# Patient Record
Sex: Female | Born: 1970 | Race: White | Hispanic: No | Marital: Married | Smoking: Former smoker
Health system: Southern US, Community
[De-identification: ages and names within clinical notes are randomized; demographics above are authoritative.]

## PROBLEM LIST (undated history)

## (undated) DIAGNOSIS — R4586 Emotional lability: Secondary | ICD-10-CM

## (undated) DIAGNOSIS — S37009A Unspecified injury of unspecified kidney, initial encounter: Secondary | ICD-10-CM

## (undated) DIAGNOSIS — Z9289 Personal history of other medical treatment: Secondary | ICD-10-CM

## (undated) DIAGNOSIS — G4730 Sleep apnea, unspecified: Secondary | ICD-10-CM

## (undated) DIAGNOSIS — J449 Chronic obstructive pulmonary disease, unspecified: Secondary | ICD-10-CM

## (undated) DIAGNOSIS — G8929 Other chronic pain: Secondary | ICD-10-CM

## (undated) DIAGNOSIS — M545 Low back pain, unspecified: Secondary | ICD-10-CM

## (undated) DIAGNOSIS — G4700 Insomnia, unspecified: Secondary | ICD-10-CM

## (undated) DIAGNOSIS — K219 Gastro-esophageal reflux disease without esophagitis: Secondary | ICD-10-CM

## (undated) DIAGNOSIS — M069 Rheumatoid arthritis, unspecified: Secondary | ICD-10-CM

## (undated) DIAGNOSIS — M797 Fibromyalgia: Secondary | ICD-10-CM

## (undated) HISTORY — PX: INCONTINENCE SURGERY: SHX676

## (undated) HISTORY — DX: Gastro-esophageal reflux disease without esophagitis: K21.9

## (undated) HISTORY — PX: BILATERAL CARPAL TUNNEL RELEASE: SHX6508

## (undated) HISTORY — PX: VAGINAL HYSTERECTOMY: SUR661

## (undated) HISTORY — PX: TUBAL LIGATION: SHX77

## (undated) HISTORY — PX: FRACTURE SURGERY: SHX138

## (undated) HISTORY — PX: TIBIA FRACTURE SURGERY: SHX806

## (undated) HISTORY — DX: Chronic obstructive pulmonary disease, unspecified: J44.9

## (undated) HISTORY — PX: FIXATION KYPHOPLASTY: SHX860

## (undated) HISTORY — PX: WISDOM TOOTH EXTRACTION: SHX21

## (undated) HISTORY — PX: BACK SURGERY: SHX140

## (undated) HISTORY — PX: AUGMENTATION MAMMAPLASTY: SUR837

---

## 1992-08-03 HISTORY — PX: EXPLORATORY LAPAROTOMY: SUR591

## 1992-08-03 HISTORY — PX: COLOSTOMY: SHX63

## 1992-08-03 HISTORY — PX: COLECTOMY: SHX59

## 1993-08-03 HISTORY — PX: COLOSTOMY REVERSAL: SHX5782

## 1999-02-12 ENCOUNTER — Emergency Department (HOSPITAL_COMMUNITY): Admission: EM | Admit: 1999-02-12 | Discharge: 1999-02-13 | Payer: Self-pay | Admitting: Emergency Medicine

## 2001-02-10 ENCOUNTER — Emergency Department (HOSPITAL_COMMUNITY): Admission: EM | Admit: 2001-02-10 | Discharge: 2001-02-10 | Payer: Self-pay | Admitting: Emergency Medicine

## 2002-02-28 ENCOUNTER — Emergency Department (HOSPITAL_COMMUNITY): Admission: EM | Admit: 2002-02-28 | Discharge: 2002-02-28 | Payer: Self-pay | Admitting: Emergency Medicine

## 2002-02-28 ENCOUNTER — Encounter: Payer: Self-pay | Admitting: Emergency Medicine

## 2003-06-01 ENCOUNTER — Encounter: Admission: RE | Admit: 2003-06-01 | Discharge: 2003-08-30 | Payer: Self-pay | Admitting: *Deleted

## 2003-07-02 ENCOUNTER — Emergency Department (HOSPITAL_COMMUNITY): Admission: EM | Admit: 2003-07-02 | Discharge: 2003-07-02 | Payer: Self-pay | Admitting: Emergency Medicine

## 2003-07-25 ENCOUNTER — Ambulatory Visit (HOSPITAL_COMMUNITY): Admission: RE | Admit: 2003-07-25 | Discharge: 2003-07-25 | Payer: Self-pay | Admitting: Neurosurgery

## 2003-08-10 ENCOUNTER — Inpatient Hospital Stay (HOSPITAL_COMMUNITY): Admission: RE | Admit: 2003-08-10 | Discharge: 2003-08-11 | Payer: Self-pay | Admitting: Neurosurgery

## 2003-11-06 ENCOUNTER — Ambulatory Visit (HOSPITAL_COMMUNITY): Admission: RE | Admit: 2003-11-06 | Discharge: 2003-11-06 | Payer: Self-pay | Admitting: Neurosurgery

## 2004-08-25 ENCOUNTER — Encounter: Payer: Self-pay | Admitting: Anesthesiology

## 2004-09-02 ENCOUNTER — Emergency Department (HOSPITAL_COMMUNITY): Admission: EM | Admit: 2004-09-02 | Discharge: 2004-09-02 | Payer: Self-pay | Admitting: Emergency Medicine

## 2005-07-08 ENCOUNTER — Encounter (INDEPENDENT_AMBULATORY_CARE_PROVIDER_SITE_OTHER): Payer: Self-pay | Admitting: Specialist

## 2005-07-08 ENCOUNTER — Inpatient Hospital Stay (HOSPITAL_COMMUNITY): Admission: RE | Admit: 2005-07-08 | Discharge: 2005-07-10 | Payer: Self-pay | Admitting: Gynecology

## 2008-01-23 ENCOUNTER — Emergency Department (HOSPITAL_COMMUNITY): Admission: EM | Admit: 2008-01-23 | Discharge: 2008-01-23 | Payer: Self-pay | Admitting: Emergency Medicine

## 2008-01-26 ENCOUNTER — Ambulatory Visit: Payer: Self-pay | Admitting: Pain Medicine

## 2008-02-13 ENCOUNTER — Other Ambulatory Visit: Payer: Self-pay | Admitting: Emergency Medicine

## 2008-02-13 ENCOUNTER — Inpatient Hospital Stay (HOSPITAL_COMMUNITY): Admission: RE | Admit: 2008-02-13 | Discharge: 2008-02-23 | Payer: Self-pay | Admitting: Psychiatry

## 2008-02-13 ENCOUNTER — Ambulatory Visit: Payer: Self-pay | Admitting: Psychiatry

## 2008-08-10 ENCOUNTER — Ambulatory Visit: Payer: Self-pay

## 2009-04-30 ENCOUNTER — Encounter: Admission: RE | Admit: 2009-04-30 | Discharge: 2009-04-30 | Payer: Self-pay | Admitting: Neurosurgery
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
## 2010-12-19 NOTE — Op Note (Signed)
[name], Nancy Decker            ACCOUNT NO.:  [id number]   MEDICAL RECORD NO.:  [id number]          PATIENT TYPE:  INP   LOCATION:  9399                          FACILITY:  [facility]   PHYSICIAN:  Ginger Carne, MD  DATE OF BIRTH:  1970-11-10   DATE OF PROCEDURE:  07/08/2005  DATE OF DISCHARGE:                                 OPERATIVE REPORT   PREOPERATIVE DIAGNOSES:  1.  Bilateral pelvic pain.  2.  Bilateral adnexal adhesions.  3.  Genuine urinary stress incontinence.   POSTOPERATIVE DIAGNOSES:  1.  Bilateral pelvic pain.  2.  Bilateral adnexal adhesions.  3.  Genuine urinary stress incontinence.   PROCEDURES:  1.  Bilateral salpingo-oophorectomy by laparotomy.  2.  Tension-free vaginal tape procedure.  3.  Cystoscopy.   SURGEON:  Ginger Carne, M.D.   ASSISTANT:  None.   COMPLICATIONS:  None immediate.   ESTIMATED BLOOD LOSS LESS:  Than 50 mL.   ANESTHESIA:  General.   SPECIMEN:  Right and left tube and ovary.   FINDINGS:  Both adnexa were adherent to their respective sidewalls with  omentum and distal intestine adherent to respective adnexa.  The uterus had  been previously excised.  Both ureters identified throughout their pelvic  course during the procedure.   The patient prepped and draped in usual fashion and placed in lithotomy  position.  Betadine solution used for antiseptic.  The patient was  catheterized prior to the procedure.  Afterwards a vertical infraumbilical  incision was made and the abdomen opened, appropriate traction and packing  was utilized.  Dissection of the ureters followed by clamp, then ligating  the round ligaments on either side and opening up the lateral peritoneal  reflection.  After identifying the ureters, the left and right  infundibulopelvic ligaments were clamped and ligated with 0 Vicryl suture  twice.  The adnexa were then carefully dissected off their respective  sidewalls, including dissection of intestinal and  omental adhesions.  Both  specimens sent separately.  Bleeding points hemostatically checked, blood  clots removed.  Irrigation utilized.  Closure of the peritoneum with 2-0  Vicryl running sutures, 0 Vicryl for the fascia, and skin staples for the  skin.  Attention was directed to the TVT portion of the procedure.   A vertical anterior vaginal wall incision was made over the midurethral  region.  The pubovesical cervical fascia was carefully dissected off the  vaginal epithelium bilaterally up to the space of Retzius.  At this point a  Foley catheter bladder guide was utilized and using a bottom-up [employer] system, a trocar was placed beneath the lower ramus of  the pubic bone, hugging the back side of said pubic bone and emanating 1-2  cm lateral to the symphysis pubis on either side.  At this point, cystoscopy  performed.  No injury to urethra or bladder, including sidewalls, dome and  trigone were noted.  Afterwards, appropriate tensioning was performed by  draining the bladder and filling it to 250 mL of normal saline.  After  appropriate tensioning, the plastic sleeves were removed.  The sheath  was  then cut beneath the either side below the skin level.  Dermabond used for  closure.  Copious irrigation followed.  Bleeding points hemostatically  checked.  Closure of the vaginal epithelium with 3-0 Monocryl running  interlocking suture.  The patient tolerated procedure the well, returned to  the postanesthesia recovery room in excellent condition.  Urine clear at the  end of the procedure.      Ginger Carne, MD  Electronically Signed     SHB/MEDQ  D:  07/08/2005  T:  07/08/2005  Job:  [id number]
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
## 2010-12-19 NOTE — Discharge Summary (Signed)
NAMECORTNE, AMARA            ACCOUNT NO.:  [id number]   MEDICAL RECORD NO.:  [id number]          PATIENT TYPE:  INP   LOCATION:  9315                          [facility]ACILITY[facility]   PHYSICIAN:  Ginger Carne, MD  DATE OF BIRTH:  [birth year]   DATE OF ADMISSION:  07/08/2005  DATE OF DISCHARGE:                                 DISCHARGE SUMMARY   REASON FOR HOSPITALIZATION:  Genuine urinary stress incontinence, chronic  pelvic pain and bilateral ovarian adhesions.   POSTOPERATIVE DIAGNOSIS:  Genuine urinary stress incontinence, chronic  pelvic pain and bilateral ovarian adhesions.   IN-HOSPITAL PROCEDURES:  Bilateral salpingo-oophorectomy by laparotomy,  tension-free vaginal tape procedure with cystoscopy.   FINAL DIAGNOSIS:  Genuine urinary stress incontinence, chronic pelvic pain  and bilateral ovarian adhesions.   HOSPITAL COURSE:  This is a 40 year old gravida 2 para 2-0-0-2 Caucasian  female admitted for the afore-mentioned procedures performed on  December6,2006, because of bilateral adhesive disease secondary to previous  surgery. The patient had a hysterectomy in 1995. In addition, the patient  has had symptoms with appropriate workup for genuine urinary stress  incontinence. Her postoperative course was uneventful. She remained  afebrile. Her incision was dry. Her calves are without tenderness. Lungs  were clear. The patient voided satisfactorily after removal of Foley  catheter 1 day after surgery. Her postoperative hemoglobin was 10.9.   The patient was discharged with routine instructions including contacting  the office for temperature elevation above 100.4 degrees Fahrenheit,  increasing abdominal or incisional pain and/or drainage, difficulty with  voiding including urinary retention symptoms. The patient was prescribed  Augmentin 500 mg twice a day for 7 days, Percocet 5/325 one to two every 4-6  hours as needed for pain, and Premarin 1.25 mg twice a day. She will  return  in 5 days to have her staples removed and then for a 4-week postoperative  follow-up visit.      Ginger Carne, MD  Electronically Signed     SHB/MEDQ  D:  07/10/2005  T:  07/10/2005  Job:  [id number]

## 2010-12-19 NOTE — Discharge Summary (Signed)
Nancy Decker, DEPACE            ACCOUNT NO.:  [id number]   MEDICAL RECORD NO.:  [id number]          PATIENT TYPE:  IPS   LOCATION:  0301                          FACILITY:  [facility]   PHYSICIAN:  Anselm Jungling, MD  DATE OF BIRTH:  27-Jun-1971   DATE OF ADMISSION:  02/13/2008  DATE OF DISCHARGE:  02/23/2008                               DISCHARGE SUMMARY   IDENTIFYING DATA/REASON FOR ADMISSION:  The patient is a 40 year old  married white female admitted for opiate dependence.  She indicated that  she had become dependent on her opiate pain medication that had been  prescribed for her.  She denied alcohol abuse, and denied other drugs of  abuse, except for some occasional marijuana.  She had also been using a  certain amount of prescription Klonopin.  Please refer to the admission  note for further details pertaining to the symptoms, circumstances and  history that led to her hospitalization.  She was given initial Axis I  diagnosis of opiate dependence, NOS and substance-induced mood disorder.   MEDICAL/LABORATORY:  The patient was medically and physically assessed  by the Psychiatric Nurse Practitioner.  She was in generally good health  without any acute medical problems, although she had had long-term  difficulties with pain and its management.  She also has a history of  asthma and was continued on Advair for this.   HOSPITAL COURSE:  The patient was admitted to the Adult Inpatient  Psychiatric service.  She presented as a well-nourished, well-developed  woman who was alert, fully oriented, and very sad, depressed and  anxious.  There were no signs or symptoms of psychosis or thought  disorder.  There was no suicidal ideation.  She was quite open about her  abuse patterns.  She stated I have got a beautiful life.  She  indicated a strong and apparently sincere desire to get into sober  recovery.   The patient was placed on a withdrawal protocol.  This was supplemented  with  Skelaxin for muscle relaxation, Vistaril, and Imitrex for migraine  headaches that occurred sporadically during her stay.   She participated in therapeutic groups and activities including those  geared towards 12-step recovery.  She was a good participant in the  treatment program.  She did have some pain complaints, and these were  addressed with a trial of Voltaren EC 75 mg b.i.d., which was somewhat  helpful.   She had significant withdrawal symptoms that diminished over the course  of her stay.  She had a fairly good attitude about this throughout,  indicating her strong resolve to get through her withdrawal, so that she  could enjoyed a clean and sober lifestyle and better health on the other  side.  Clonidine and sometimes Librium were used on a p.r.n. basis to  get her through this period.   Her withdrawal continued to be challenging for her, with protracted  withdrawal symptoms, occasional problems with migraine, and general  fatigue, and not feeling well.  Her mood was depressed through much of  this.  However, we reached a point where it appeared that she  was no  longer experiencing physical discomfort due to withdrawal, but more  likely to other underlying physical issues.  The patient agreed with [place]  that it was appropriate for her to be discharged to outpatient care.  She agreed to the following aftercare plan.   AFTERCARE:  The patient was to follow-up at the [facility] with an  appointment on the day following discharge, February 24, 2008.   DISCHARGE MEDICATIONS:  1. Advair 250/50 1 puff twice daily.  2. Cymbalta 30 mg daily.   DISCHARGE DIAGNOSES:  AXIS I: Opiate dependence and substance-induced  mood disorder.  AXIS II: Deferred.  AXIS III: Chronic pain, asthma.  AXIS IV: Stressors severe.  AXIS V: GAF on discharge 55.      Anselm Jungling, MD  Electronically Signed     SPB/MEDQ  D:  03/14/2008  T:  03/14/2008  Job:  [phone]

## 2011-04-30 LAB — URINALYSIS, ROUTINE W REFLEX MICROSCOPIC
Bilirubin Urine: NEGATIVE
Bilirubin Urine: NEGATIVE
Glucose, UA: NEGATIVE
Glucose, UA: NEGATIVE
Hgb urine dipstick: NEGATIVE
Hgb urine dipstick: NEGATIVE
Ketones, ur: NEGATIVE
Nitrite: NEGATIVE
Nitrite: NEGATIVE
Protein, ur: NEGATIVE
Protein, ur: NEGATIVE
Specific Gravity, Urine: 1.018
Specific Gravity, Urine: 1.037 — ABNORMAL HIGH
Urobilinogen, UA: 0.2
Urobilinogen, UA: 0.2
pH: 6
pH: 6

## 2011-04-30 LAB — DIFFERENTIAL
Basophils Absolute: 0
Basophils Relative: 0
Eosinophils Absolute: 0
Eosinophils Relative: 0
Lymphocytes Relative: 17
Lymphs Abs: 1.4
Monocytes Absolute: 0.4
Monocytes Relative: 5
Neutro Abs: 6.3
Neutrophils Relative %: 77

## 2011-04-30 LAB — CBC
HCT: 35.5 — ABNORMAL LOW
Hemoglobin: 12.1
MCHC: 34.2
MCV: 91
Platelets: 240
RBC: 3.9
RDW: 13.2
WBC: 8.2

## 2011-04-30 LAB — POCT I-STAT, CHEM 8
BUN: 7
Calcium, Ion: 1.2
Chloride: 107
Creatinine, Ser: 0.7
Glucose, Bld: 98
HCT: 36
HCT: 42
Hemoglobin: 12.2
Hemoglobin: 14.3
Potassium: 4.1
Potassium: 3.7
Sodium: 140
Sodium: 143
TCO2: 26
TCO2: 25

## 2011-04-30 LAB — ETHANOL: Alcohol, Ethyl (B): <5

## 2011-04-30 LAB — POCT CARDIAC MARKERS
CKMB, poc: <1 — ABNORMAL LOW
Myoglobin, poc: 27
Operator id: 234501
Troponin i, poc: <0.05

## 2011-04-30 LAB — POCT PREGNANCY, URINE
Operator id: 234501
Preg Test, Ur: NEGATIVE

## 2011-04-30 LAB — RAPID URINE DRUG SCREEN, HOSP PERFORMED
Amphetamines: NOT DETECTED
Barbiturates: NOT DETECTED
Benzodiazepines: NOT DETECTED
Cocaine: POSITIVE — AB
Opiates: POSITIVE — AB
Tetrahydrocannabinol: POSITIVE — AB

## 2011-04-30 LAB — PREGNANCY, URINE: Preg Test, Ur: NEGATIVE

## 2011-10-08 ENCOUNTER — Observation Stay (HOSPITAL_COMMUNITY)
Admission: EM | Admit: 2011-10-08 | Discharge: 2011-10-09 | Disposition: A | Discharge status: Home Health | Payer: Self-pay | Source: Ambulatory Visit | Attending: Emergency Medicine | Admitting: Emergency Medicine

## 2011-10-08 ENCOUNTER — Encounter (HOSPITAL_COMMUNITY): Payer: Self-pay

## 2011-10-08 ENCOUNTER — Emergency Department (HOSPITAL_COMMUNITY): Payer: Self-pay

## 2011-10-08 DIAGNOSIS — R300 Dysuria: Secondary | ICD-10-CM | POA: Insufficient documentation

## 2011-10-08 DIAGNOSIS — N12 Tubulo-interstitial nephritis, not specified as acute or chronic: Principal | ICD-10-CM | POA: Insufficient documentation

## 2011-10-08 DIAGNOSIS — R109 Unspecified abdominal pain: Secondary | ICD-10-CM

## 2011-10-08 HISTORY — DX: Unspecified injury of unspecified kidney, initial encounter: S37.009A

## 2011-10-08 LAB — DIFFERENTIAL
Lymphocytes Relative: 13 % (ref 12–46)
Lymphs Abs: 1.7 10*3/uL (ref 0.7–4.0)
Monocytes Relative: 9 % (ref 3–12)
Neutro Abs: 9.9 10*3/uL — ABNORMAL HIGH (ref 1.7–7.7)
Neutrophils Relative %: 78 % — ABNORMAL HIGH (ref 43–77)

## 2011-10-08 LAB — URINE MICROSCOPIC-ADD ON

## 2011-10-08 LAB — CBC
MCV: 88.8 fL (ref 78.0–100.0)
Platelets: 217 10*3/uL (ref 150–400)
RDW: 12.6 % (ref 11.5–15.5)
WBC: 12.7 10*3/uL — ABNORMAL HIGH (ref 4.0–10.5)

## 2011-10-08 LAB — BASIC METABOLIC PANEL
CO2: 26 mEq/L (ref 19–32)
Chloride: 104 mEq/L (ref 96–112)
GFR calc non Af Amer: >90 mL/min (ref >90)
Glucose, Bld: 94 mg/dL (ref 70–99)
Potassium: 4.8 mEq/L (ref 3.5–5.1)
Sodium: 138 mEq/L (ref 135–145)

## 2011-10-08 LAB — URINALYSIS, ROUTINE W REFLEX MICROSCOPIC
Nitrite: POSITIVE — AB
Protein, ur: 100 mg/dL — AB
Urobilinogen, UA: 0.2 mg/dL (ref 0.0–1.0)

## 2011-10-08 MED ORDER — ONDANSETRON HCL 4 MG/2ML IJ SOLN
INTRAMUSCULAR | Status: AC
  Administered 2011-10-08: 4 mg via INTRAVENOUS
  Filled 2011-10-08: qty 2

## 2011-10-08 MED ORDER — HYDROMORPHONE HCL PF 1 MG/ML IJ SOLN
1.0000 mg | Freq: Once | INTRAMUSCULAR | Status: AC
  Administered 2011-10-08: 1 mg via INTRAVENOUS
  Filled 2011-10-08: qty 1

## 2011-10-08 MED ORDER — HYDROMORPHONE HCL PF 1 MG/ML IJ SOLN
1.0000 mg | INTRAMUSCULAR | Status: DC | PRN
  Administered 2011-10-09: 1 mg via INTRAVENOUS
  Filled 2011-10-08: qty 1

## 2011-10-08 MED ORDER — ONDANSETRON HCL 4 MG/2ML IJ SOLN: 4.0000 mg | Freq: Four times a day (QID) | INTRAMUSCULAR | Status: DC | PRN

## 2011-10-08 MED ORDER — IBUPROFEN 200 MG PO TABS: 600.0000 mg | ORAL_TABLET | Freq: Four times a day (QID) | ORAL | Status: DC | PRN

## 2011-10-08 MED ORDER — SODIUM CHLORIDE 0.9 % IV SOLN
Freq: Once | INTRAVENOUS | Status: AC
  Administered 2011-10-09: via INTRAVENOUS

## 2011-10-08 MED ORDER — ONDANSETRON HCL 4 MG/2ML IJ SOLN
4.0000 mg | Freq: Once | INTRAMUSCULAR | Status: AC
  Administered 2011-10-08 (×3): 4 mg via INTRAVENOUS
  Filled 2011-10-08: qty 2

## 2011-10-08 MED ORDER — FENTANYL CITRATE 0.05 MG/ML IJ SOLN
50.0000 ug | Freq: Once | INTRAMUSCULAR | Status: AC
  Administered 2011-10-08: 50 ug via INTRAVENOUS
  Filled 2011-10-08: qty 2

## 2011-10-08 MED ORDER — OXYCODONE-ACETAMINOPHEN 5-325 MG PO TABS
1.0000 | ORAL_TABLET | ORAL | Status: DC | PRN
  Administered 2011-10-09: 2 via ORAL
  Filled 2011-10-08: qty 2

## 2011-10-08 MED ORDER — CEPHALEXIN 500 MG PO CAPS: 500.0000 mg | ORAL_CAPSULE | Freq: Four times a day (QID) | ORAL | Status: AC

## 2011-10-08 MED ORDER — HYDROMORPHONE HCL PF 2 MG/ML IJ SOLN
INTRAMUSCULAR | Status: AC
  Administered 2011-10-08: 2 mg
  Filled 2011-10-08: qty 1

## 2011-10-08 MED ORDER — ZOLPIDEM TARTRATE 5 MG PO TABS
5.0000 mg | ORAL_TABLET | Freq: Every evening | ORAL | Status: DC | PRN
  Administered 2011-10-09: 5 mg via ORAL
  Filled 2011-10-08: qty 1

## 2011-10-08 MED ORDER — DEXTROSE 5 % IV SOLN
1.0000 g | Freq: Once | INTRAVENOUS | Status: AC
  Administered 2011-10-08: 1 g via INTRAVENOUS
  Filled 2011-10-08: qty 10

## 2011-10-08 NOTE — ED Notes (Signed)
Pt resting with eyes closed, respirations equal/ non-labored.  Low snoring noted and pt is easily awakened.
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
## 2011-10-08 NOTE — ED Notes (Signed)
Pt report given in person to [place], Charity fundraiser.  Pt prepared for transport to CDU.

## 2011-10-08 NOTE — ED Provider Notes (Signed)
Patient placed in CDU under pyelonephritis protocol.  Patient feeling better after IV fluids and medications, but continues to have episodes of pain.   At present, resting quietly with family at bedside.  Will need keflex 500 mg qid x 10 days upon discharge.  Jimmye Norman, NP 10/08/11 [phone]

## 2011-10-08 NOTE — Discharge Instructions (Signed)
Pyelonephritis, Adult Pyelonephritis is a kidney infection. In general, there are 2 main types of pyelonephritis:  Infections that come on quickly without any warning (acute pyelonephritis).   Infections that persist for a long period of time (chronic pyelonephritis).  CAUSES  Two main causes of pyelonephritis are:  Bacteria traveling from the bladder to the kidney. This is a problem especially in pregnant women. The urine in the bladder can become filled with bacteria from multiple causes, including:   Inflammation of the prostate gland (prostatitis).   Sexual intercourse in females.   Bladder infection (cystitis).   Bacteria traveling from the bloodstream to the tissue part of the kidney.  Problems that may increase your risk of getting a kidney infection include:  Diabetes.   Kidney stones or bladder stones.   Cancer.   Catheters placed in the bladder.   Other abnormalities of the kidney or ureter.  SYMPTOMS   Abdominal pain.   Pain in the side or flank area.   Fever.   Chills.   Upset stomach.   Blood in the urine (dark urine).   Frequent urination.   Strong or persistent urge to urinate.   Burning or stinging when urinating.  DIAGNOSIS  Your caregiver may diagnose your kidney infection based on your symptoms. A urine sample may also be taken. TREATMENT  In general, treatment depends on how severe the infection is.   If the infection is mild and caught early, your caregiver may treat you with oral antibiotics and send you home.   If the infection is more severe, the bacteria may have gotten into the bloodstream. This will require intravenous (IV) antibiotics and a hospital stay. Symptoms may include:   High fever.   Severe flank pain.   Shaking chills.   Even after a hospital stay, your caregiver may require you to be on oral antibiotics for a period of time.   Other treatments may be required depending upon the cause of the infection.  HOME CARE  INSTRUCTIONS   Take your antibiotics as directed. Finish them even if you start to feel better.   Make an appointment to have your urine checked to make sure the infection is gone.   Drink enough fluids to keep your urine clear or pale yellow.   Take medicines for the bladder if you have urgency and frequency of urination as directed by your caregiver.  SEEK IMMEDIATE MEDICAL CARE IF:   You have a fever.   You are unable to take your antibiotics or fluids.   You develop shaking chills.   You experience extreme weakness or fainting.   There is no improvement after 2 days of treatment.  MAKE SURE YOU:  Understand these instructions.   Will watch your condition.   Will get help right away if you are not doing well or get worse.  Document Released: 07/20/2005 Document Revised: 07/09/2011 Document Reviewed: 12/24/2010 [employer] Patient Information 2012 [employer]. 
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
## 2011-10-08 NOTE — ED Provider Notes (Signed)
CDU admission note  Patient is a 41 year old female who presents today with a month of flank pain and evaluation consistent with pyelonephritis today. CT of abdomen and pelvis showed no kidney stones. Renal function was not compromised. Patient was treated with Rocephin while in the acute care he the emergency department. She continued to have nausea, vomiting, and pain and admission for further management of these symptoms was warranted.  CV: Regular rate and rhythm, no murmurs rubs or gallops Pulm: Bilaterally, no crackles wheezes or rales GI: nl BS, SNTND  Plan: Admit to CDU protocol for pyelonephritis. Patient has orders placed. Report given to PA.   Cyndra Numbers, MD 10/09/11 [phone]

## 2011-10-09 MED ORDER — CIPROFLOXACIN HCL 500 MG PO TABS: 500.0000 mg | ORAL_TABLET | Freq: Two times a day (BID) | ORAL | Status: AC

## 2011-10-09 MED ORDER — OXYCODONE-ACETAMINOPHEN 5-325 MG PO TABS: 1.0000 | ORAL_TABLET | Freq: Four times a day (QID) | ORAL | Status: AC | PRN

## 2011-10-09 NOTE — ED Provider Notes (Signed)
Assumed care of patient in the CDU.  Patient is currently under Pyelonephritis protocol.  Patient has been given IVF and IV antibiotics overnight.  Reassessed patient.  Patient reports that she continues to have lower back pain, but that she is feeling better than she was when she came to the ED last evening.  VSS.  Patient alert and orientated x 3, Heart RRR, Lungs CTAB, Abdomen soft and nontender.  Positive CVA tenderness.  Patient able to tolerate po liquids.  No nausea or vomiting at this time.    Pascal Lux [place], PA-C 10/09/11 [id number]  9:24 AM Reassessed patient.  Patient reports that her pain has improved since this morning.  Patient able to tolerate po liquids.  No nausea or vomiting at this time.  Patient states that she feels that her back pain is tolerable at this time.  Patient is requesting to be discharged home.  Feel that patient can be discharged home at this time.  Patient afebrile, VSS.  Patient alert and orientated x 3, Heart RRR, Lungs CTAB, Abdomen soft and nontender.  Pascal Lux [place], PA-C 10/09/11 1727

## 2011-10-09 NOTE — ED Notes (Signed)
Pt returns from bathroom and is crying in bed curled up in a ball. States wants to go home. Also states pain is severe. PA aware, order given.

## 2011-10-09 NOTE — ED Notes (Signed)
States feeling better. Ginger ale and saltines given.

## 2011-10-10 LAB — URINE CULTURE

## 2011-10-10 NOTE — ED Provider Notes (Signed)
I saw and evaluated the patient, reviewed the resident's note and I agree with the findings and plan.  Cyndra Numbers, MD 10/10/11 1721

## 2011-10-10 NOTE — ED Provider Notes (Signed)
Medical screening examination/treatment/procedure(s) were conducted as a shared visit with non-physician practitioner(s) and myself.  I personally evaluated the patient during the encounter   Nancy Sprout, MD 10/10/11 1540

## 2011-10-10 NOTE — ED Provider Notes (Signed)
Medical screening examination/treatment/procedure(s) were conducted as a shared visit with non-physician practitioner(s) and myself.  I personally evaluated the patient during the encounter  Cyndra Numbers, MD 10/10/11 1720

## 2011-10-11 NOTE — ED Notes (Signed)
+   Urine. Patient treated with Keflex and Cipro. Sensitive to same. Per protocol MD.

## 2012-01-15 ENCOUNTER — Encounter: Payer: Self-pay | Admitting: *Deleted

## 2012-02-08 ENCOUNTER — Emergency Department (HOSPITAL_COMMUNITY): Payer: Self-pay

## 2012-02-08 ENCOUNTER — Encounter (HOSPITAL_COMMUNITY): Payer: Self-pay | Admitting: Emergency Medicine

## 2012-02-08 ENCOUNTER — Emergency Department (HOSPITAL_COMMUNITY)
Admission: EM | Admit: 2012-02-08 | Discharge: 2012-02-08 | Disposition: A | Discharge status: Home / Self Care | Payer: Self-pay | Attending: Emergency Medicine | Admitting: Emergency Medicine

## 2012-02-08 DIAGNOSIS — W1789XA Other fall from one level to another, initial encounter: Secondary | ICD-10-CM | POA: Insufficient documentation

## 2012-02-08 DIAGNOSIS — S8010XA Contusion of unspecified lower leg, initial encounter: Secondary | ICD-10-CM | POA: Insufficient documentation

## 2012-02-08 DIAGNOSIS — S20229A Contusion of unspecified back wall of thorax, initial encounter: Secondary | ICD-10-CM | POA: Insufficient documentation

## 2012-02-08 DIAGNOSIS — F17200 Nicotine dependence, unspecified, uncomplicated: Secondary | ICD-10-CM | POA: Insufficient documentation

## 2012-02-08 DIAGNOSIS — W19XXXA Unspecified fall, initial encounter: Secondary | ICD-10-CM

## 2012-02-08 DIAGNOSIS — J4489 Other specified chronic obstructive pulmonary disease: Secondary | ICD-10-CM | POA: Insufficient documentation

## 2012-02-08 DIAGNOSIS — S8012XA Contusion of left lower leg, initial encounter: Secondary | ICD-10-CM

## 2012-02-08 DIAGNOSIS — K219 Gastro-esophageal reflux disease without esophagitis: Secondary | ICD-10-CM | POA: Insufficient documentation

## 2012-02-08 DIAGNOSIS — J449 Chronic obstructive pulmonary disease, unspecified: Secondary | ICD-10-CM | POA: Insufficient documentation

## 2012-02-08 DIAGNOSIS — S300XXA Contusion of lower back and pelvis, initial encounter: Secondary | ICD-10-CM

## 2012-02-08 MED ORDER — OXYCODONE-ACETAMINOPHEN 5-325 MG PO TABS
1.0000 | ORAL_TABLET | Freq: Once | ORAL | Status: AC
  Administered 2012-02-08: 1 via ORAL
  Filled 2012-02-08: qty 1

## 2012-02-08 MED ORDER — OXYCODONE-ACETAMINOPHEN 5-325 MG PO TABS: 1.0000 | ORAL_TABLET | ORAL | Status: AC | PRN
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
## 2012-09-26 ENCOUNTER — Encounter: Payer: Self-pay | Admitting: *Deleted

## 2012-09-27 ENCOUNTER — Encounter: Payer: Self-pay | Admitting: *Deleted

## 2013-02-05 ENCOUNTER — Emergency Department (HOSPITAL_COMMUNITY)
Admission: EM | Admit: 2013-02-05 | Discharge: 2013-02-05 | Disposition: A | Discharge status: Home / Self Care | Payer: No Typology Code available for payment source | Attending: Emergency Medicine | Admitting: Emergency Medicine

## 2013-02-05 ENCOUNTER — Encounter (HOSPITAL_COMMUNITY): Payer: Self-pay | Admitting: Emergency Medicine

## 2013-02-05 DIAGNOSIS — Y9389 Activity, other specified: Secondary | ICD-10-CM | POA: Insufficient documentation

## 2013-02-05 DIAGNOSIS — J4489 Other specified chronic obstructive pulmonary disease: Secondary | ICD-10-CM | POA: Insufficient documentation

## 2013-02-05 DIAGNOSIS — J449 Chronic obstructive pulmonary disease, unspecified: Secondary | ICD-10-CM | POA: Insufficient documentation

## 2013-02-05 DIAGNOSIS — Z8719 Personal history of other diseases of the digestive system: Secondary | ICD-10-CM | POA: Insufficient documentation

## 2013-02-05 DIAGNOSIS — F17200 Nicotine dependence, unspecified, uncomplicated: Secondary | ICD-10-CM | POA: Insufficient documentation

## 2013-02-05 DIAGNOSIS — IMO0002 Reserved for concepts with insufficient information to code with codable children: Secondary | ICD-10-CM | POA: Insufficient documentation

## 2013-02-05 DIAGNOSIS — Z87828 Personal history of other (healed) physical injury and trauma: Secondary | ICD-10-CM | POA: Insufficient documentation

## 2013-02-05 DIAGNOSIS — Z9889 Other specified postprocedural states: Secondary | ICD-10-CM | POA: Insufficient documentation

## 2013-02-05 DIAGNOSIS — Y92410 Unspecified street and highway as the place of occurrence of the external cause: Secondary | ICD-10-CM | POA: Insufficient documentation

## 2013-02-05 MED ORDER — OXYCODONE-ACETAMINOPHEN 5-325 MG PO TABS
2.0000 | ORAL_TABLET | Freq: Once | ORAL | Status: AC
  Administered 2013-02-05: 2 via ORAL
  Filled 2013-02-05: qty 2

## 2013-02-05 MED ORDER — OXYCODONE-ACETAMINOPHEN 5-325 MG PO TABS: 1.0000 | ORAL_TABLET | Freq: Four times a day (QID) | ORAL | Status: DC | PRN

## 2013-02-05 MED ORDER — DIAZEPAM 5 MG PO TABS: 5.0000 mg | ORAL_TABLET | Freq: Two times a day (BID) | ORAL | Status: DC
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
## 2013-02-05 NOTE — ED Provider Notes (Signed)
Medical screening examination/treatment/procedure(s) were performed by non-physician practitioner and as supervising physician I was immediately available for consultation/collaboration.   Marylou Wages R. Jaevin Medearis, MD 02/05/13 2352 

## 2013-02-12 ENCOUNTER — Encounter (HOSPITAL_COMMUNITY): Payer: Self-pay | Admitting: Emergency Medicine

## 2013-02-12 ENCOUNTER — Emergency Department (HOSPITAL_COMMUNITY)
Admission: EM | Admit: 2013-02-12 | Discharge: 2013-02-12 | Disposition: A | Discharge status: Home / Self Care | Payer: No Typology Code available for payment source | Attending: Emergency Medicine | Admitting: Emergency Medicine

## 2013-02-12 DIAGNOSIS — Z8719 Personal history of other diseases of the digestive system: Secondary | ICD-10-CM | POA: Insufficient documentation

## 2013-02-12 DIAGNOSIS — J4489 Other specified chronic obstructive pulmonary disease: Secondary | ICD-10-CM | POA: Insufficient documentation

## 2013-02-12 DIAGNOSIS — R3915 Urgency of urination: Secondary | ICD-10-CM | POA: Insufficient documentation

## 2013-02-12 DIAGNOSIS — Z79899 Other long term (current) drug therapy: Secondary | ICD-10-CM | POA: Insufficient documentation

## 2013-02-12 DIAGNOSIS — M545 Low back pain, unspecified: Secondary | ICD-10-CM | POA: Insufficient documentation

## 2013-02-12 DIAGNOSIS — J449 Chronic obstructive pulmonary disease, unspecified: Secondary | ICD-10-CM | POA: Insufficient documentation

## 2013-02-12 DIAGNOSIS — F17200 Nicotine dependence, unspecified, uncomplicated: Secondary | ICD-10-CM | POA: Insufficient documentation

## 2013-02-12 DIAGNOSIS — N390 Urinary tract infection, site not specified: Secondary | ICD-10-CM | POA: Insufficient documentation

## 2013-02-12 DIAGNOSIS — Z87828 Personal history of other (healed) physical injury and trauma: Secondary | ICD-10-CM | POA: Insufficient documentation

## 2013-02-12 DIAGNOSIS — Z9889 Other specified postprocedural states: Secondary | ICD-10-CM | POA: Insufficient documentation

## 2013-02-12 DIAGNOSIS — G8929 Other chronic pain: Secondary | ICD-10-CM

## 2013-02-12 LAB — URINALYSIS, ROUTINE W REFLEX MICROSCOPIC
Glucose, UA: NEGATIVE mg/dL
Protein, ur: NEGATIVE mg/dL
Specific Gravity, Urine: 1.02 (ref 1.005–1.030)

## 2013-02-12 LAB — URINE MICROSCOPIC-ADD ON

## 2013-02-12 MED ORDER — CYCLOBENZAPRINE HCL 10 MG PO TABS: 10.0000 mg | ORAL_TABLET | Freq: Two times a day (BID) | ORAL | Status: DC | PRN

## 2013-02-12 MED ORDER — CIPROFLOXACIN HCL 500 MG PO TABS: 500.0000 mg | ORAL_TABLET | Freq: Two times a day (BID) | ORAL | Status: DC

## 2013-02-12 MED ORDER — PREDNISONE 20 MG PO TABS: 40.0000 mg | ORAL_TABLET | Freq: Every day | ORAL | Status: DC

## 2013-02-12 MED ORDER — KETOROLAC TROMETHAMINE 60 MG/2ML IM SOLN
60.0000 mg | Freq: Once | INTRAMUSCULAR | Status: AC
  Administered 2013-02-12: 60 mg via INTRAMUSCULAR
  Filled 2013-02-12: qty 2
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
## 2013-02-13 LAB — URINE CULTURE: Culture: NO GROWTH

## 2013-02-13 NOTE — ED Provider Notes (Signed)
Medical screening examination/treatment/procedure(s) were performed by non-physician practitioner and as supervising physician I was immediately available for consultation/collaboration.   Carleene Cooper III, MD 02/13/13 [phone]

## 2013-03-30 ENCOUNTER — Other Ambulatory Visit: Payer: Self-pay | Admitting: Family Medicine

## 2013-03-30 DIAGNOSIS — M545 Low back pain, unspecified: Secondary | ICD-10-CM

## 2013-04-07 ENCOUNTER — Ambulatory Visit
Admission: RE | Admit: 2013-04-07 | Discharge: 2013-04-07 | Disposition: A | Discharge status: Home / Self Care | Payer: BC Managed Care – PPO | Source: Ambulatory Visit | Attending: Family Medicine | Admitting: Family Medicine

## 2013-04-07 DIAGNOSIS — M545 Low back pain, unspecified: Secondary | ICD-10-CM

## 2013-04-07 MED ORDER — GADOBENATE DIMEGLUMINE 529 MG/ML IV SOLN
10.0000 mL | Freq: Once | INTRAVENOUS | Status: AC | PRN
  Administered 2013-04-07: 10 mL via INTRAVENOUS

## 2013-06-26 ENCOUNTER — Emergency Department (HOSPITAL_COMMUNITY)
Admission: EM | Admit: 2013-06-26 | Discharge: 2013-06-26 | Disposition: A | Discharge status: Home / Self Care | Payer: BC Managed Care – PPO | Attending: Emergency Medicine | Admitting: Emergency Medicine

## 2013-06-26 ENCOUNTER — Emergency Department (HOSPITAL_COMMUNITY): Payer: BC Managed Care – PPO

## 2013-06-26 ENCOUNTER — Encounter (HOSPITAL_COMMUNITY): Payer: Self-pay | Admitting: Emergency Medicine

## 2013-06-26 DIAGNOSIS — R109 Unspecified abdominal pain: Secondary | ICD-10-CM | POA: Insufficient documentation

## 2013-06-26 DIAGNOSIS — R112 Nausea with vomiting, unspecified: Secondary | ICD-10-CM | POA: Insufficient documentation

## 2013-06-26 DIAGNOSIS — J4489 Other specified chronic obstructive pulmonary disease: Secondary | ICD-10-CM | POA: Insufficient documentation

## 2013-06-26 DIAGNOSIS — Z8719 Personal history of other diseases of the digestive system: Secondary | ICD-10-CM | POA: Insufficient documentation

## 2013-06-26 DIAGNOSIS — F17200 Nicotine dependence, unspecified, uncomplicated: Secondary | ICD-10-CM | POA: Insufficient documentation

## 2013-06-26 DIAGNOSIS — Z79899 Other long term (current) drug therapy: Secondary | ICD-10-CM | POA: Insufficient documentation

## 2013-06-26 DIAGNOSIS — J449 Chronic obstructive pulmonary disease, unspecified: Secondary | ICD-10-CM | POA: Insufficient documentation

## 2013-06-26 DIAGNOSIS — Z87828 Personal history of other (healed) physical injury and trauma: Secondary | ICD-10-CM | POA: Insufficient documentation

## 2013-06-26 DIAGNOSIS — R197 Diarrhea, unspecified: Secondary | ICD-10-CM | POA: Insufficient documentation

## 2013-06-26 LAB — COMPREHENSIVE METABOLIC PANEL
AST: 14 U/L (ref 0–37)
Albumin: 4.1 g/dL (ref 3.5–5.2)
Alkaline Phosphatase: 60 U/L (ref 39–117)
BUN: 10 mg/dL (ref 6–23)
Chloride: 100 mEq/L (ref 96–112)
Potassium: 3.1 mEq/L — ABNORMAL LOW (ref 3.5–5.1)
Total Bilirubin: 0.5 mg/dL (ref 0.3–1.2)

## 2013-06-26 LAB — CBC WITH DIFFERENTIAL/PLATELET
Basophils Relative: 0 % (ref 0–1)
Hemoglobin: 15.1 g/dL — ABNORMAL HIGH (ref 12.0–15.0)
MCHC: 35.3 g/dL (ref 30.0–36.0)
Monocytes Relative: 8 % (ref 3–12)
Neutro Abs: 5.8 10*3/uL (ref 1.7–7.7)
Neutrophils Relative %: 55 % (ref 43–77)
RBC: 4.72 MIL/uL (ref 3.87–5.11)
WBC: 10.5 10*3/uL (ref 4.0–10.5)

## 2013-06-26 LAB — URINALYSIS, ROUTINE W REFLEX MICROSCOPIC
Glucose, UA: NEGATIVE mg/dL
Ketones, ur: NEGATIVE mg/dL
Protein, ur: 30 mg/dL — AB

## 2013-06-26 LAB — URINE MICROSCOPIC-ADD ON

## 2013-06-26 MED ORDER — ONDANSETRON HCL 4 MG/2ML IJ SOLN
4.0000 mg | Freq: Once | INTRAMUSCULAR | Status: AC
  Administered 2013-06-26: 4 mg via INTRAVENOUS
  Filled 2013-06-26: qty 2

## 2013-06-26 MED ORDER — IOHEXOL 300 MG/ML  SOLN
25.0000 mL | INTRAMUSCULAR | Status: AC
  Administered 2013-06-26: 25 mL via ORAL

## 2013-06-26 MED ORDER — IOHEXOL 300 MG/ML  SOLN
100.0000 mL | Freq: Once | INTRAMUSCULAR | Status: AC | PRN
  Administered 2013-06-26: 80 mL via INTRAVENOUS

## 2013-06-26 MED ORDER — SODIUM CHLORIDE 0.9 % IV BOLUS (SEPSIS)
1000.0000 mL | Freq: Once | INTRAVENOUS | Status: AC
  Administered 2013-06-26: 1000 mL via INTRAVENOUS

## 2013-06-26 MED ORDER — ONDANSETRON 8 MG PO TBDP: 8.0000 mg | ORAL_TABLET | Freq: Three times a day (TID) | ORAL | Status: DC | PRN

## 2013-06-26 MED ORDER — MORPHINE SULFATE 4 MG/ML IJ SOLN
4.0000 mg | Freq: Once | INTRAMUSCULAR | Status: AC
  Administered 2013-06-26: 4 mg via INTRAVENOUS
  Filled 2013-06-26: qty 1

## 2013-06-26 NOTE — ED Notes (Signed)
Discharge instructions reviewed with pt. Pt verbalized understanding.   

## 2013-06-26 NOTE — ED Notes (Signed)
Pt is here with vomiting, diarrhea, and lower abdominal pain since Friday morning.
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
## 2013-06-26 NOTE — ED Notes (Signed)
Pt c/o vomiting and diarrhea that started Friday morning at 3am, woke her out of her sleep. Pt denies being around anyone who has been sick. Pt states she has vomiting 10xs and diarrhea stools 20xs. Pt also states she has some abd cramping, pt rates pain 5/10. Pt has fentanyl patch and states she took some pain medication around 1 pm.

## 2017-04-13 ENCOUNTER — Encounter (HOSPITAL_COMMUNITY): Payer: Self-pay | Admitting: Emergency Medicine

## 2017-04-13 ENCOUNTER — Emergency Department (HOSPITAL_COMMUNITY)
Admission: EM | Admit: 2017-04-13 | Discharge: 2017-04-13 | Disposition: A | Discharge status: Home / Self Care | Payer: 59 | Attending: Emergency Medicine | Admitting: Emergency Medicine

## 2017-04-13 ENCOUNTER — Emergency Department (HOSPITAL_COMMUNITY): Payer: 59

## 2017-04-13 DIAGNOSIS — R197 Diarrhea, unspecified: Secondary | ICD-10-CM | POA: Diagnosis not present

## 2017-04-13 DIAGNOSIS — J449 Chronic obstructive pulmonary disease, unspecified: Secondary | ICD-10-CM | POA: Diagnosis not present

## 2017-04-13 DIAGNOSIS — R109 Unspecified abdominal pain: Secondary | ICD-10-CM | POA: Insufficient documentation

## 2017-04-13 DIAGNOSIS — G8929 Other chronic pain: Secondary | ICD-10-CM | POA: Diagnosis not present

## 2017-04-13 DIAGNOSIS — K625 Hemorrhage of anus and rectum: Secondary | ICD-10-CM | POA: Insufficient documentation

## 2017-04-13 DIAGNOSIS — Z79899 Other long term (current) drug therapy: Secondary | ICD-10-CM | POA: Diagnosis not present

## 2017-04-13 DIAGNOSIS — R1110 Vomiting, unspecified: Secondary | ICD-10-CM | POA: Insufficient documentation

## 2017-04-13 DIAGNOSIS — F17210 Nicotine dependence, cigarettes, uncomplicated: Secondary | ICD-10-CM | POA: Insufficient documentation

## 2017-04-13 LAB — CBC
HCT: 41.1 % (ref 36.0–46.0)
Hemoglobin: 14.1 g/dL (ref 12.0–15.0)
MCH: 30.4 pg (ref 26.0–34.0)
MCHC: 34.3 g/dL (ref 30.0–36.0)
MCV: 88.6 fL (ref 78.0–100.0)
PLATELETS: 269 10*3/uL (ref 150–400)
RBC: 4.64 MIL/uL (ref 3.87–5.11)
RDW: 13 % (ref 11.5–15.5)
WBC: 7.6 10*3/uL (ref 4.0–10.5)

## 2017-04-13 LAB — COMPREHENSIVE METABOLIC PANEL
ALBUMIN: 4.2 g/dL (ref 3.5–5.0)
ALK PHOS: 76 U/L (ref 38–126)
ALT: 16 U/L (ref 14–54)
AST: 16 U/L (ref 15–41)
Anion gap: 8 (ref 5–15)
BUN: 10 mg/dL (ref 6–20)
CALCIUM: 9.2 mg/dL (ref 8.9–10.3)
CHLORIDE: 107 mmol/L (ref 101–111)
CO2: 24 mmol/L (ref 22–32)
CREATININE: 0.62 mg/dL (ref 0.44–1.00)
GFR calc non Af Amer: >60 mL/min (ref >60)
GLUCOSE: 109 mg/dL — AB (ref 65–99)
Potassium: 3.7 mmol/L (ref 3.5–5.1)
SODIUM: 139 mmol/L (ref 135–145)
Total Bilirubin: 0.6 mg/dL (ref 0.3–1.2)
Total Protein: 7.3 g/dL (ref 6.5–8.1)

## 2017-04-13 MED ORDER — IOPAMIDOL (ISOVUE-300) INJECTION 61%
100.0000 mL | Freq: Once | INTRAVENOUS | Status: AC | PRN
  Administered 2017-04-13: 100 mL via INTRAVENOUS

## 2017-04-13 MED ORDER — DICYCLOMINE HCL 20 MG PO TABS: 20.0000 mg | ORAL_TABLET | Freq: Two times a day (BID) | ORAL | 0 refills | Status: DC | PRN

## 2017-04-13 MED ORDER — ONDANSETRON 4 MG PO TBDP: 4.0000 mg | ORAL_TABLET | Freq: Three times a day (TID) | ORAL | 0 refills | Status: DC | PRN

## 2017-04-13 MED ORDER — IOPAMIDOL (ISOVUE-300) INJECTION 61%
INTRAVENOUS | Status: AC
  Administered 2017-04-13: 15:00:00
  Filled 2017-04-13: qty 100

## 2017-04-13 NOTE — ED Notes (Signed)
Bed: WA04 Expected date:  Expected time:  Means of arrival:  Comments: 
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
## 2017-04-13 NOTE — ED Triage Notes (Signed)
Patient reports that she had rectal bleeding for 3 months. Patient reports she got dizzy and weak and fell on Sunday. C/o back pain and unable to eat since Sunday.

## 2017-04-13 NOTE — Discharge Instructions (Addendum)
You were evaluated in the emergency room for your chronic abdominal pain and rectal bleeding. While in the ED we checked your blood work. Your blood levels were within normal limits and you did not have evidence of an infection. Your blood work was also not concerning for a problem with your gallbladder or liver. Your CT scan did not show evidence of a small bowel obstruction, acute pancreatitis, acute colitis, or other acute infection. It is most likely that your abdominal pain and bright red blood per rectum are a result of a chronic medical condition. You will need to call [facility] after discharge from the ED to schedule an appointment for evaluation of a possible chronic medical condition that is causing your symptoms.

## 2017-04-14 ENCOUNTER — Encounter: Payer: Self-pay | Admitting: Physician Assistant

## 2017-04-28 ENCOUNTER — Ambulatory Visit (INDEPENDENT_AMBULATORY_CARE_PROVIDER_SITE_OTHER): Payer: 59 | Admitting: Physician Assistant

## 2017-04-28 ENCOUNTER — Encounter: Payer: Self-pay | Admitting: Physician Assistant

## 2017-04-28 VITALS — BP 98/72 | HR 72 | Ht 60.0 in | Wt 151.8 lb

## 2017-04-28 DIAGNOSIS — R1084 Generalized abdominal pain: Secondary | ICD-10-CM | POA: Diagnosis not present

## 2017-04-28 DIAGNOSIS — K625 Hemorrhage of anus and rectum: Secondary | ICD-10-CM | POA: Diagnosis not present

## 2017-04-28 DIAGNOSIS — K219 Gastro-esophageal reflux disease without esophagitis: Secondary | ICD-10-CM | POA: Diagnosis not present

## 2017-04-28 DIAGNOSIS — R194 Change in bowel habit: Secondary | ICD-10-CM | POA: Diagnosis not present

## 2017-04-28 MED ORDER — NA SULFATE-K SULFATE-MG SULF 17.5-3.13-1.6 GM/177ML PO SOLN: 1.0000 | ORAL | 0 refills | Status: DC

## 2017-04-28 MED ORDER — PANTOPRAZOLE SODIUM 40 MG PO TBEC: 40.0000 mg | DELAYED_RELEASE_TABLET | Freq: Two times a day (BID) | ORAL | 5 refills | Status: DC
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
## 2017-04-28 NOTE — Patient Instructions (Signed)
You have been scheduled for an endoscopy and colonoscopy. Please follow the written instructions given to you at your visit today. Please pick up your prep supplies at the pharmacy within the next 1-3 days. If you use inhalers (even only as needed), please bring them with you on the day of your procedure. Your physician has requested that you go to www.startemmi.com and enter the access code given to you at your visit today. This web site gives a general overview about your procedure. However, you should still follow specific instructions given to you by our office regarding your preparation for the procedure.  We have sent the following medications to your pharmacy for you to pick up at your convenience: Pantoprazole 40 mg twice a day 30-60 mins before breakfast and dinner

## 2017-05-14 ENCOUNTER — Encounter: Payer: Self-pay | Admitting: Internal Medicine

## 2017-05-27 ENCOUNTER — Ambulatory Visit (AMBULATORY_SURGERY_CENTER): Payer: 59 | Admitting: Internal Medicine

## 2017-05-27 ENCOUNTER — Encounter: Payer: Self-pay | Admitting: Internal Medicine

## 2017-05-27 VITALS — BP 121/95 | HR 77 | Temp 98.0°F | Resp 16 | Ht 60.0 in | Wt 151.0 lb

## 2017-05-27 DIAGNOSIS — R1084 Generalized abdominal pain: Secondary | ICD-10-CM | POA: Diagnosis present

## 2017-05-27 DIAGNOSIS — K219 Gastro-esophageal reflux disease without esophagitis: Secondary | ICD-10-CM | POA: Diagnosis not present

## 2017-05-27 DIAGNOSIS — K625 Hemorrhage of anus and rectum: Secondary | ICD-10-CM | POA: Diagnosis not present

## 2017-05-27 DIAGNOSIS — K2950 Unspecified chronic gastritis without bleeding: Secondary | ICD-10-CM | POA: Diagnosis not present

## 2017-05-27 DIAGNOSIS — D123 Benign neoplasm of transverse colon: Secondary | ICD-10-CM

## 2017-05-27 DIAGNOSIS — D124 Benign neoplasm of descending colon: Secondary | ICD-10-CM

## 2017-05-27 DIAGNOSIS — K635 Polyp of colon: Secondary | ICD-10-CM

## 2017-05-27 MED ORDER — SODIUM CHLORIDE 0.9 % IV SOLN: 500.0000 mL | INTRAVENOUS | Status: DC

## 2017-05-27 NOTE — Progress Notes (Signed)
To PACU, VSS. Report to [email or web address] 

## 2017-05-27 NOTE — Patient Instructions (Signed)
YOU HAD AN ENDOSCOPIC PROCEDURE TODAY AT [facility]:   Refer to the procedure report that was given to you for any specific questions about what was found during the examination.  If the procedure report does not answer your questions, please call your gastroenterologist to clarify.  If you requested that your care partner not be given the details of your procedure findings, then the procedure report has been included in a sealed envelope for you to review at your convenience later.  YOU SHOULD EXPECT: Some feelings of bloating in the abdomen. Passage of more gas than usual.  Walking can help get rid of the air that was put into your GI tract during the procedure and reduce the bloating. If you had a lower endoscopy (such as a colonoscopy or flexible sigmoidoscopy) you may notice spotting of blood in your stool or on the toilet paper. If you underwent a bowel prep for your procedure, you may not have a normal bowel movement for a few days.  Please Note:  You might notice some irritation and congestion in your nose or some drainage.  This is from the oxygen used during your procedure.  There is no need for concern and it should clear up in a day or so.  SYMPTOMS TO REPORT IMMEDIATELY:   Following lower endoscopy (colonoscopy or flexible sigmoidoscopy):  Excessive amounts of blood in the stool  Significant tenderness or worsening of abdominal pains  Swelling of the abdomen that is new, acute  Fever of 100F or higher   Following upper endoscopy (EGD)  Vomiting of blood or coffee ground material  New chest pain or pain under the shoulder blades  Painful or persistently difficult swallowing  New shortness of breath  Fever of 100F or higher  Black, tarry-looking stools  For urgent or emergent issues, a gastroenterologist can be reached at any hour by calling [phone].   DIET:  We do recommend a small meal at first, but then you may proceed to your regular diet.  Drink  plenty of fluids but you should avoid alcoholic beverages for 24 hours. High fiber diet is encouraged and plenty of water.  ACTIVITY:  You should plan to take it easy for the rest of today and you should NOT DRIVE or use heavy machinery until tomorrow (because of the sedation medicines used during the test).    FOLLOW UP: Our staff will call the number listed on your records the next business day following your procedure to check on you and address any questions or concerns that you may have regarding the information given to you following your procedure. If we do not reach you, we will leave a message.  However, if you are feeling well and you are not experiencing any problems, there is no need to return our call.  We will assume that you have returned to your regular daily activities without incident.  If any biopsies were taken you will be contacted by phone or by letter within the next 1-3 weeks.  Please call us at [phone] if you have not heard about the biopsies in 3 weeks.    SIGNATURES/CONFIDENTIALITY: You and/or your care partner have signed paperwork which will be entered into your electronic medical record.  These signatures attest to the fact that that the information above on your After Visit Summary has been reviewed and is understood.  Full responsibility of the confidentiality of this discharge information lies with you and/or your care-partner.  Read all  of the handouts given to you  by your recovery room nurse.

## 2017-05-27 NOTE — Progress Notes (Signed)
Pt. Reports no change in her medical or surgical history since her pre-visit 04/28/2017.

## 2017-05-27 NOTE — Op Note (Signed)
[place] Patient Name: Nancy Decker Procedure Date: 05/27/2017 3:21 PM MRN: [id number] Endoscopist: Jerene Bears , MD Age: 46 Referring MD:  Date of Birth: 1971-06-26 Gender: Female Account #: [id number] Procedure:                Colonoscopy Indications:              Generalized abdominal pain, Rectal bleeding Medicines:                Monitored Anesthesia Care Procedure:                Pre-Anesthesia Assessment:                           - Prior to the procedure, a History and Physical                            was performed, and patient medications and                            allergies were reviewed. The patient's tolerance of                            previous anesthesia was also reviewed. The risks                            and benefits of the procedure and the sedation                            options and risks were discussed with the patient.                            All questions were answered, and informed consent                            was obtained. Prior Anticoagulants: The patient has                            taken no previous anticoagulant or antiplatelet                            agents. ASA Grade Assessment: II - A patient with                            mild systemic disease. After reviewing the risks                            and benefits, the patient was deemed in                            satisfactory condition to undergo the procedure.                           After obtaining informed consent, the colonoscope  was passed under direct vision. Throughout the                            procedure, the patient's blood pressure, pulse, and                            oxygen saturations were monitored continuously. The                            Colonoscope was introduced through the anus and                            advanced to the the cecum, identified by                            appendiceal orifice and  ileocecal valve. The                            colonoscopy was performed without difficulty. The                            patient tolerated the procedure well. The quality                            of the bowel preparation was good. The ileocecal                            valve, appendiceal orifice, and rectum were                            photographed. Scope In: 3:30:15 PM Scope Out: 3:42:04 PM Scope Withdrawal Time: 0 hours 9 minutes 39 seconds  Total Procedure Duration: 0 hours 11 minutes 49 seconds  Findings:                 The digital rectal exam was normal.                           A 4 mm polyp was found in the transverse colon. The                            polyp was sessile. The polyp was removed with a                            cold snare. Resection and retrieval were complete.                           A 4 mm polyp was found in the descending colon. The                            polyp was sessile. The polyp was removed with a                            cold snare. Resection  and retrieval were complete.                           Scattered small-mouthed diverticula were found in                            the sigmoid colon, descending colon and ascending                            colon. Complications:            No immediate complications. Estimated Blood Loss:     Estimated blood loss was minimal. Impression:               - One 4 mm polyp in the transverse colon, removed                            with a cold snare. Resected and retrieved.                           - One 4 mm polyp in the descending colon, removed                            with a cold snare. Resected and retrieved.                           - Mild diverticulosis in the sigmoid colon, in the                            descending colon and in the ascending colon. Recommendation:           - Patient has a contact number available for                            emergencies. The signs and symptoms of  potential                            delayed complications were discussed with the                            patient. Return to normal activities tomorrow.                            Written discharge instructions were provided to the                            patient.                           - Resume previous diet.                           - Continue present medications.                           - Await pathology results.                           -  Repeat colonoscopy is recommended. The                            colonoscopy date will be determined after pathology                            results from today's exam become available for                            review. Jerene Bears, MD 05/27/2017 3:47:43 PM This report has been signed electronically.

## 2017-05-27 NOTE — Progress Notes (Signed)
Called to room to assist during endoscopic procedure.  Patient ID and intended procedure confirmed with present staff. Received instructions for my participation in the procedure from the performing physician.  

## 2017-05-27 NOTE — Op Note (Signed)
[name] Procedure Date: 05/27/2017 3:21 PM MRN: [id number] Endoscopist: Jerene Bears , MD Age: 46 Referring MD:  Date of Birth: [birth year] Gender: Female Account #: [id number] Procedure:                Upper GI endoscopy Indications:              Generalized abdominal pain, Gastro-esophageal                            reflux disease Medicines:                Monitored Anesthesia Care Procedure:                Pre-Anesthesia Assessment:                           - Prior to the procedure, a History and Physical                            was performed, and patient medications and                            allergies were reviewed. The patient's tolerance of                            previous anesthesia was also reviewed. The risks                            and benefits of the procedure and the sedation                            options and risks were discussed with the patient.                            All questions were answered, and informed consent                            was obtained. Prior Anticoagulants: The patient has                            taken no previous anticoagulant or antiplatelet                            agents. ASA Grade Assessment: II - A patient with                            mild systemic disease. After reviewing the risks                            and benefits, the patient was deemed in                            satisfactory condition to undergo the procedure.  After obtaining informed consent, the endoscope was                            passed under direct vision. Throughout the                            procedure, the patient's blood pressure, pulse, and                            oxygen saturations were monitored continuously. The                            Model GIF-HQ190 [id number]) scope was introduced                            through the mouth, and advanced to the  second part                            of duodenum. The upper GI endoscopy was                            accomplished without difficulty. The patient                            tolerated the procedure well. Scope In: Scope Out: Findings:                 Normal mucosa was found in the entire esophagus.                           A 3 cm hiatal hernia was present.                           Localized mild inflammation characterized by                            erythema and granularity was found in the                            prepyloric region of the stomach. Biopsies were                            taken with a cold forceps for histology and                            Helicobacter pylori testing.                           The examined duodenum was normal. Complications:            No immediate complications. Estimated Blood Loss:     Estimated blood loss was minimal. Impression:               - Normal mucosa was found in the entire esophagus.                           -  3 cm hiatal hernia.                           - Gastritis. Biopsied.                           - Normal examined duodenum. Recommendation:           - Patient has a contact number available for                            emergencies. The signs and symptoms of potential                            delayed complications were discussed with the                            patient. Return to normal activities tomorrow.                            Written discharge instructions were provided to the                            patient.                           - Resume previous diet.                           - Continue present medications.                           - Await pathology results. Jerene Bears, MD 05/27/2017 3:44:52 PM This report has been signed electronically.

## 2017-05-28 ENCOUNTER — Telehealth: Payer: Self-pay

## 2017-05-28 NOTE — Telephone Encounter (Signed)
Called #[phone] and left a messaged we tried to reach pt for a follow up call. maw

## 2017-05-28 NOTE — Telephone Encounter (Signed)
  Follow up Call-  Call back number 05/27/2017  Post procedure Call Back phone  # [phone]  Permission to leave phone message Yes  Some recent data might be hidden     Patient questions:  Do you have a fever, pain , or abdominal swelling? No. Pain Score  0 *  Have you tolerated food without any problems? Yes.    Have you been able to return to your normal activities? Yes.    Do you have any questions about your discharge instructions: Diet   No. Medications  No. Follow up visit  No.  Do you have questions or concerns about your Care? No.  Actions: * If pain score is 4 or above: No action needed, pain <4.

## 2017-06-03 ENCOUNTER — Encounter: Payer: Self-pay | Admitting: Internal Medicine

## 2017-10-26 ENCOUNTER — Emergency Department (HOSPITAL_COMMUNITY)
Admission: EM | Admit: 2017-10-26 | Discharge: 2017-10-26 | Discharge status: Against Medical Advice | Payer: 59 | Attending: Emergency Medicine | Admitting: Emergency Medicine

## 2017-10-26 ENCOUNTER — Other Ambulatory Visit: Payer: Self-pay

## 2017-10-26 ENCOUNTER — Encounter (HOSPITAL_COMMUNITY): Payer: Self-pay | Admitting: Emergency Medicine

## 2017-10-26 DIAGNOSIS — R1031 Right lower quadrant pain: Secondary | ICD-10-CM | POA: Diagnosis present

## 2017-10-26 DIAGNOSIS — Z5321 Procedure and treatment not carried out due to patient leaving prior to being seen by health care provider: Secondary | ICD-10-CM | POA: Diagnosis not present

## 2017-10-26 HISTORY — DX: Insomnia, unspecified: G47.00

## 2017-10-26 HISTORY — DX: Emotional lability: R45.86

## 2017-10-26 NOTE — ED Notes (Signed)
Pt called for room x2. No answer,

## 2017-10-26 NOTE — ED Notes (Signed)
Pt called for room. No answer 

## 2017-10-26 NOTE — ED Notes (Signed)
Pt called for room x3 with no answer. 

## 2017-10-26 NOTE — ED Triage Notes (Addendum)
Pt reports r/groin pain x 1 month. OPt stated that it is radiating to the r/knee.Pt is concerned that she blacked out one month ago and pulled a muscle in r/groin. Pt is taking an occasional Percocet for the pain. Pt has hx of back pain and was last prescribed medication 2 months ago. Pt ambulated to treatment area with supervision and uses a cane when needed Pt is requesting to be screened for possible blood clot.

## 2017-10-26 NOTE — ED Notes (Signed)
Last called. It appears that patient left without being seen

## 2017-10-29 ENCOUNTER — Ambulatory Visit (HOSPITAL_COMMUNITY)
Admission: RE | Admit: 2017-10-29 | Discharge: 2017-10-29 | Disposition: A | Discharge status: Home / Self Care | Payer: 59 | Source: Home / Self Care | Attending: Psychiatry | Admitting: Psychiatry

## 2017-10-29 DIAGNOSIS — R197 Diarrhea, unspecified: Secondary | ICD-10-CM | POA: Insufficient documentation

## 2017-10-29 DIAGNOSIS — R443 Hallucinations, unspecified: Secondary | ICD-10-CM

## 2017-10-29 DIAGNOSIS — Z79899 Other long term (current) drug therapy: Secondary | ICD-10-CM | POA: Diagnosis not present

## 2017-10-29 DIAGNOSIS — F17210 Nicotine dependence, cigarettes, uncomplicated: Secondary | ICD-10-CM | POA: Insufficient documentation

## 2017-10-29 DIAGNOSIS — J449 Chronic obstructive pulmonary disease, unspecified: Secondary | ICD-10-CM | POA: Diagnosis not present

## 2017-10-29 DIAGNOSIS — R112 Nausea with vomiting, unspecified: Secondary | ICD-10-CM | POA: Insufficient documentation

## 2017-10-29 DIAGNOSIS — R45851 Suicidal ideations: Secondary | ICD-10-CM

## 2017-10-29 DIAGNOSIS — G4700 Insomnia, unspecified: Secondary | ICD-10-CM

## 2017-10-29 DIAGNOSIS — F1910 Other psychoactive substance abuse, uncomplicated: Secondary | ICD-10-CM | POA: Insufficient documentation

## 2017-10-29 DIAGNOSIS — R450 Nervousness: Secondary | ICD-10-CM

## 2017-10-29 DIAGNOSIS — F1110 Opioid abuse, uncomplicated: Secondary | ICD-10-CM | POA: Diagnosis not present

## 2017-10-29 DIAGNOSIS — R5383 Other fatigue: Secondary | ICD-10-CM

## 2017-10-29 DIAGNOSIS — F329 Major depressive disorder, single episode, unspecified: Secondary | ICD-10-CM | POA: Insufficient documentation
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
## 2017-10-29 NOTE — H&P (Signed)
Behavioral Health Medical Screening Exam  Nancy Decker is an 47 y.o. female.  Total Time spent with patient: 20 minutes  Psychiatric Specialty Exam: Physical Exam  Constitutional: She is oriented to person, place, and time. She appears well-developed and well-nourished. No distress.  HENT:  Head: Normocephalic and atraumatic.  Right Ear: External ear normal.  Left Ear: External ear normal.  Eyes: Conjunctivae are normal. Right eye exhibits no discharge. Left eye exhibits no discharge.  Cardiovascular: Normal rate.  Respiratory: Effort normal. No respiratory distress.  Musculoskeletal: Normal range of motion.  Neurological: She is alert and oriented to person, place, and time.  Skin: Skin is warm and dry. She is not diaphoretic.  Psychiatric: Her mood appears anxious. She is not withdrawn and not actively hallucinating. Thought content is not paranoid and not delusional. Cognition and memory are normal. She expresses impulsivity and inappropriate judgment. She exhibits a depressed mood. She expresses suicidal ideation. She expresses no homicidal ideation. She expresses suicidal plans.    Review of Systems  Constitutional: Positive for malaise/fatigue. Negative for chills, diaphoresis, fever and weight loss.  Respiratory: Negative for cough and shortness of breath.   Cardiovascular: Negative for chest pain.  Gastrointestinal: Positive for diarrhea, nausea and vomiting. Negative for abdominal pain.  Neurological: Positive for tremors and weakness.  Psychiatric/Behavioral: Positive for depression, hallucinations, substance abuse and suicidal ideas. Negative for memory loss. The patient is nervous/anxious and has insomnia.     Blood pressure (!) 134/97, pulse 71, temperature 98.2 F (36.8 C), resp. rate 18, SpO2 96 %.There is no height or weight on file to calculate BMI.  General Appearance: Disheveled  Eye Contact:  Fair  Speech:  Clear and Coherent and Normal Rate  Volume:   Normal  Mood:  Anxious, Depressed, Dysphoric, Hopeless and Worthless  Affect:  Congruent and Depressed  Thought Process:  Coherent, Goal Directed and Descriptions of Associations: Intact  Orientation:  Full (Time, Place, and Person)  Thought Content:  Logical and Hallucinations: Visual  Suicidal Thoughts:  Yes.  with intent/plan  Homicidal Thoughts:  No  Memory:  Immediate;   Good Recent;   Good  Judgement:  Impaired  Insight:  Lacking  Psychomotor Activity:  Restlessness  Concentration: Concentration: Fair and Attention Span: Fair  Recall:  Good  Fund of Knowledge:Good  Language: Good  Akathisia:  No  Handed:  Right  AIMS (if indicated):     Assets:  Communication Skills Desire for Improvement Financial Resources/Insurance Housing Intimacy Leisure Time Transportation  Sleep:       Musculoskeletal: Strength & Muscle Tone: within normal limits Gait & Station: normal   Blood pressure (!) 134/97, pulse 71, temperature 98.2 F (36.8 C), resp. rate 18, SpO2 96 %.  Recommendations:  Based on my evaluation the patient does not appear to have an emergency medical condition.  Patient is actively vomiting small amounts and reports diarrhea today. Will send for medical clearance.  Rozetta Nunnery, NP 10/29/2017, 9:46 PM

## 2017-10-30 ENCOUNTER — Encounter (HOSPITAL_COMMUNITY): Payer: Self-pay | Admitting: Emergency Medicine

## 2017-10-30 ENCOUNTER — Encounter (HOSPITAL_COMMUNITY): Payer: Self-pay

## 2017-10-30 ENCOUNTER — Emergency Department (HOSPITAL_COMMUNITY): Payer: 59

## 2017-10-30 ENCOUNTER — Other Ambulatory Visit: Payer: Self-pay

## 2017-10-30 ENCOUNTER — Inpatient Hospital Stay (HOSPITAL_COMMUNITY)
Admission: AD | Admit: 2017-10-30 | Discharge: 2017-11-03 | DRG: 885 | Disposition: A | Discharge status: Home / Self Care | Payer: 59 | Source: Intra-hospital | Attending: Psychiatry | Admitting: Psychiatry

## 2017-10-30 ENCOUNTER — Emergency Department (HOSPITAL_COMMUNITY)
Admission: EM | Admit: 2017-10-30 | Discharge: 2017-10-30 | Disposition: A | Discharge status: Other Acute Inpatient Hospital | Payer: 59 | Attending: Emergency Medicine | Admitting: Emergency Medicine

## 2017-10-30 DIAGNOSIS — F1120 Opioid dependence, uncomplicated: Secondary | ICD-10-CM | POA: Diagnosis present

## 2017-10-30 DIAGNOSIS — Z79899 Other long term (current) drug therapy: Secondary | ICD-10-CM | POA: Diagnosis not present

## 2017-10-30 DIAGNOSIS — F1110 Opioid abuse, uncomplicated: Secondary | ICD-10-CM | POA: Diagnosis not present

## 2017-10-30 DIAGNOSIS — G8929 Other chronic pain: Secondary | ICD-10-CM | POA: Diagnosis present

## 2017-10-30 DIAGNOSIS — G4700 Insomnia, unspecified: Secondary | ICD-10-CM | POA: Diagnosis present

## 2017-10-30 DIAGNOSIS — Z716 Tobacco abuse counseling: Secondary | ICD-10-CM | POA: Diagnosis not present

## 2017-10-30 DIAGNOSIS — R45851 Suicidal ideations: Secondary | ICD-10-CM | POA: Diagnosis present

## 2017-10-30 DIAGNOSIS — F1220 Cannabis dependence, uncomplicated: Secondary | ICD-10-CM | POA: Diagnosis present

## 2017-10-30 DIAGNOSIS — F1910 Other psychoactive substance abuse, uncomplicated: Secondary | ICD-10-CM | POA: Diagnosis present

## 2017-10-30 DIAGNOSIS — Z6379 Other stressful life events affecting family and household: Secondary | ICD-10-CM | POA: Diagnosis not present

## 2017-10-30 DIAGNOSIS — Z915 Personal history of self-harm: Secondary | ICD-10-CM

## 2017-10-30 DIAGNOSIS — R440 Auditory hallucinations: Secondary | ICD-10-CM | POA: Diagnosis present

## 2017-10-30 DIAGNOSIS — F4310 Post-traumatic stress disorder, unspecified: Secondary | ICD-10-CM | POA: Diagnosis present

## 2017-10-30 DIAGNOSIS — F332 Major depressive disorder, recurrent severe without psychotic features: Principal | ICD-10-CM | POA: Diagnosis present

## 2017-10-30 DIAGNOSIS — F1410 Cocaine abuse, uncomplicated: Secondary | ICD-10-CM | POA: Diagnosis not present

## 2017-10-30 DIAGNOSIS — R450 Nervousness: Secondary | ICD-10-CM | POA: Diagnosis not present

## 2017-10-30 DIAGNOSIS — F1210 Cannabis abuse, uncomplicated: Secondary | ICD-10-CM | POA: Diagnosis not present

## 2017-10-30 DIAGNOSIS — K219 Gastro-esophageal reflux disease without esophagitis: Secondary | ICD-10-CM | POA: Diagnosis present

## 2017-10-30 DIAGNOSIS — J449 Chronic obstructive pulmonary disease, unspecified: Secondary | ICD-10-CM | POA: Diagnosis present

## 2017-10-30 DIAGNOSIS — Z90710 Acquired absence of both cervix and uterus: Secondary | ICD-10-CM

## 2017-10-30 DIAGNOSIS — F419 Anxiety disorder, unspecified: Secondary | ICD-10-CM | POA: Diagnosis not present

## 2017-10-30 DIAGNOSIS — Z91410 Personal history of adult physical and sexual abuse: Secondary | ICD-10-CM | POA: Diagnosis not present

## 2017-10-30 DIAGNOSIS — M549 Dorsalgia, unspecified: Secondary | ICD-10-CM | POA: Diagnosis not present

## 2017-10-30 DIAGNOSIS — F410 Panic disorder [episodic paroxysmal anxiety] without agoraphobia: Secondary | ICD-10-CM | POA: Diagnosis present

## 2017-10-30 DIAGNOSIS — F4010 Social phobia, unspecified: Secondary | ICD-10-CM | POA: Diagnosis not present

## 2017-10-30 DIAGNOSIS — F17210 Nicotine dependence, cigarettes, uncomplicated: Secondary | ICD-10-CM | POA: Diagnosis present

## 2017-10-30 DIAGNOSIS — M2550 Pain in unspecified joint: Secondary | ICD-10-CM | POA: Diagnosis not present

## 2017-10-30 LAB — CBC
HEMATOCRIT: 44.1 % (ref 36.0–46.0)
HEMOGLOBIN: 14.7 g/dL (ref 12.0–15.0)
MCH: 30.4 pg (ref 26.0–34.0)
MCHC: 33.3 g/dL (ref 30.0–36.0)
MCV: 91.3 fL (ref 78.0–100.0)
Platelets: 328 10*3/uL (ref 150–400)
RBC: 4.83 MIL/uL (ref 3.87–5.11)
RDW: 13 % (ref 11.5–15.5)
WBC: 11.8 10*3/uL — AB (ref 4.0–10.5)

## 2017-10-30 LAB — I-STAT BETA HCG BLOOD, ED (MC, WL, AP ONLY)

## 2017-10-30 LAB — COMPREHENSIVE METABOLIC PANEL
ALBUMIN: 4.9 g/dL (ref 3.5–5.0)
ALK PHOS: 75 U/L (ref 38–126)
ALT: 16 U/L (ref 14–54)
AST: 16 U/L (ref 15–41)
Anion gap: 12 (ref 5–15)
BILIRUBIN TOTAL: 0.4 mg/dL (ref 0.3–1.2)
BUN: 13 mg/dL (ref 6–20)
CALCIUM: 9.9 mg/dL (ref 8.9–10.3)
CO2: 26 mmol/L (ref 22–32)
Chloride: 105 mmol/L (ref 101–111)
Creatinine, Ser: 0.59 mg/dL (ref 0.44–1.00)
GFR calc Af Amer: >60 mL/min (ref >60)
GFR calc non Af Amer: >60 mL/min (ref >60)
GLUCOSE: 127 mg/dL — AB (ref 65–99)
Potassium: 3.9 mmol/L (ref 3.5–5.1)
SODIUM: 143 mmol/L (ref 135–145)
Total Protein: 8.3 g/dL — ABNORMAL HIGH (ref 6.5–8.1)

## 2017-10-30 LAB — RAPID URINE DRUG SCREEN, HOSP PERFORMED
Amphetamines: NOT DETECTED
BARBITURATES: NOT DETECTED
BENZODIAZEPINES: NOT DETECTED
COCAINE: POSITIVE — AB
Opiates: NOT DETECTED
TETRAHYDROCANNABINOL: POSITIVE — AB

## 2017-10-30 LAB — ETHANOL: Alcohol, Ethyl (B): <10 mg/dL (ref <10)

## 2017-10-30 LAB — SALICYLATE LEVEL: Salicylate Lvl: <7 mg/dL (ref 2.8–30.0)

## 2017-10-30 LAB — ACETAMINOPHEN LEVEL: Acetaminophen (Tylenol), Serum: <10 ug/mL — ABNORMAL LOW (ref 10–30)

## 2017-10-30 MED ORDER — ALUM & MAG HYDROXIDE-SIMETH 200-200-20 MG/5ML PO SUSP: 30.0000 mL | ORAL | Status: DC | PRN

## 2017-10-30 MED ORDER — QUETIAPINE FUMARATE 300 MG PO TABS
300.0000 mg | ORAL_TABLET | Freq: Every day | ORAL | Status: DC
  Administered 2017-10-30 – 2017-11-02 (×4): 300 mg via ORAL
  Filled 2017-10-30 (×7): qty 1

## 2017-10-30 MED ORDER — ONDANSETRON 4 MG PO TBDP: 4.0000 mg | ORAL_TABLET | Freq: Four times a day (QID) | ORAL | Status: DC | PRN

## 2017-10-30 MED ORDER — CLONIDINE HCL 0.1 MG PO TABS
0.1000 mg | ORAL_TABLET | Freq: Every day | ORAL | Status: DC
  Filled 2017-10-30 (×2): qty 1

## 2017-10-30 MED ORDER — ONDANSETRON 4 MG PO TBDP
4.0000 mg | ORAL_TABLET | Freq: Once | ORAL | Status: DC
  Filled 2017-10-30: qty 1

## 2017-10-30 MED ORDER — LORAZEPAM 1 MG PO TABS
1.0000 mg | ORAL_TABLET | Freq: Once | ORAL | Status: AC
  Administered 2017-10-30: 1 mg via ORAL
  Filled 2017-10-30: qty 1

## 2017-10-30 MED ORDER — CLONIDINE HCL 0.1 MG PO TABS: 0.1000 mg | ORAL_TABLET | Freq: Every day | ORAL | Status: DC

## 2017-10-30 MED ORDER — METHOCARBAMOL 500 MG PO TABS
500.0000 mg | ORAL_TABLET | Freq: Three times a day (TID) | ORAL | Status: DC | PRN
  Administered 2017-10-30 – 2017-11-02 (×5): 500 mg via ORAL
  Filled 2017-10-30 (×6): qty 1

## 2017-10-30 MED ORDER — MAGNESIUM HYDROXIDE 400 MG/5ML PO SUSP
30.0000 mL | Freq: Every day | ORAL | Status: DC | PRN
  Administered 2017-11-01 – 2017-11-02 (×2): 30 mL via ORAL
  Filled 2017-10-30 (×2): qty 30

## 2017-10-30 MED ORDER — NAPROXEN 500 MG PO TABS
500.0000 mg | ORAL_TABLET | Freq: Two times a day (BID) | ORAL | Status: DC | PRN
  Administered 2017-10-30: 500 mg via ORAL
  Filled 2017-10-30: qty 1

## 2017-10-30 MED ORDER — QUETIAPINE FUMARATE 300 MG PO TABS
300.0000 mg | ORAL_TABLET | Freq: Every day | ORAL | Status: DC
  Administered 2017-10-30: 300 mg via ORAL
  Filled 2017-10-30: qty 1

## 2017-10-30 MED ORDER — DICYCLOMINE HCL 20 MG PO TABS
20.0000 mg | ORAL_TABLET | Freq: Four times a day (QID) | ORAL | Status: DC | PRN
Start: 2017-10-30 — End: 2017-10-30
  Administered 2017-10-30: 20 mg via ORAL
  Filled 2017-10-30: qty 1

## 2017-10-30 MED ORDER — HYDROXYZINE HCL 25 MG PO TABS
25.0000 mg | ORAL_TABLET | Freq: Four times a day (QID) | ORAL | Status: DC | PRN
  Administered 2017-10-30: 25 mg via ORAL
  Filled 2017-10-30: qty 1

## 2017-10-30 MED ORDER — NAPROXEN 500 MG PO TABS
500.0000 mg | ORAL_TABLET | Freq: Two times a day (BID) | ORAL | Status: DC | PRN
  Administered 2017-10-30 – 2017-11-02 (×5): 500 mg via ORAL
  Filled 2017-10-30 (×6): qty 1

## 2017-10-30 MED ORDER — DICYCLOMINE HCL 20 MG PO TABS
20.0000 mg | ORAL_TABLET | Freq: Four times a day (QID) | ORAL | Status: DC | PRN
  Administered 2017-10-30 – 2017-11-01 (×3): 20 mg via ORAL
  Filled 2017-10-30 (×3): qty 1

## 2017-10-30 MED ORDER — ACETAMINOPHEN 325 MG PO TABS
650.0000 mg | ORAL_TABLET | Freq: Four times a day (QID) | ORAL | Status: DC | PRN
  Administered 2017-10-30 – 2017-11-01 (×3): 650 mg via ORAL
  Filled 2017-10-30 (×3): qty 2

## 2017-10-30 MED ORDER — CLONIDINE HCL 0.1 MG PO TABS
0.1000 mg | ORAL_TABLET | Freq: Four times a day (QID) | ORAL | Status: DC
  Administered 2017-10-30: 0.1 mg via ORAL
  Filled 2017-10-30: qty 1

## 2017-10-30 MED ORDER — LOPERAMIDE HCL 2 MG PO CAPS: 2.0000 mg | ORAL_CAPSULE | ORAL | Status: DC | PRN

## 2017-10-30 MED ORDER — ONDANSETRON 4 MG PO TBDP
4.0000 mg | ORAL_TABLET | Freq: Four times a day (QID) | ORAL | Status: DC | PRN
  Administered 2017-10-30 (×2): 4 mg via ORAL
  Filled 2017-10-30: qty 1

## 2017-10-30 MED ORDER — NICOTINE 21 MG/24HR TD PT24
21.0000 mg | MEDICATED_PATCH | Freq: Every day | TRANSDERMAL | Status: DC
  Administered 2017-10-30 – 2017-11-03 (×5): 21 mg via TRANSDERMAL
  Filled 2017-10-30 (×8): qty 1

## 2017-10-30 MED ORDER — CLONIDINE HCL 0.1 MG PO TABS: 0.1000 mg | ORAL_TABLET | Freq: Two times a day (BID) | ORAL | Status: DC

## 2017-10-30 MED ORDER — CLONIDINE HCL 0.1 MG PO TABS
0.1000 mg | ORAL_TABLET | Freq: Four times a day (QID) | ORAL | Status: AC
  Administered 2017-10-30 – 2017-11-01 (×8): 0.1 mg via ORAL
  Filled 2017-10-30 (×10): qty 1

## 2017-10-30 MED ORDER — ACETAMINOPHEN 500 MG PO TABS
1000.0000 mg | ORAL_TABLET | Freq: Once | ORAL | Status: AC
  Administered 2017-10-30: 1000 mg via ORAL
  Filled 2017-10-30: qty 2

## 2017-10-30 MED ORDER — CLONIDINE HCL 0.1 MG PO TABS
0.1000 mg | ORAL_TABLET | ORAL | Status: AC
  Administered 2017-11-01 – 2017-11-03 (×4): 0.1 mg via ORAL
  Filled 2017-10-30 (×4): qty 1

## 2017-10-30 MED ORDER — METHOCARBAMOL 500 MG PO TABS
500.0000 mg | ORAL_TABLET | Freq: Three times a day (TID) | ORAL | Status: DC | PRN
  Administered 2017-10-30: 500 mg via ORAL
  Filled 2017-10-30: qty 1

## 2017-10-30 MED ORDER — HYDROXYZINE HCL 25 MG PO TABS
25.0000 mg | ORAL_TABLET | Freq: Four times a day (QID) | ORAL | Status: DC | PRN
  Administered 2017-10-30 – 2017-11-03 (×5): 25 mg via ORAL
  Filled 2017-10-30 (×5): qty 1

## 2017-10-30 NOTE — ED Notes (Signed)
Pt appears to be agitated, states she has a plan to kill her self with a gun. Is cooperating with her care, would like her night time meds so that she can sleep at this tme. I will continue to assess.

## 2017-10-30 NOTE — BHH Counselor (Signed)
Clinician spoke to [place], [place] and expressed pt has a bed at [facility] once medically cleared. Kristine, RN reported, the pt is still throwing up. Discussed with Penelope Galas.   Vertell Novak, MS, [facility], [facility] Triage Specialist [phone]

## 2017-10-30 NOTE — ED Notes (Signed)
PT DISCHARGED WITH [facility] WITHOUT EVENT. PT PLEASANT AND COOPERATIVE WITH CARE. [place] ON PT CURRENT STATUS

## 2017-10-30 NOTE — ED Notes (Signed)
ED TO INPATIENT HANDOFF REPORT  Name/Age/Gender Nancy Decker 47 y.o. female  Code Status    Code Status Orders  (From admission, onward)        Start     Ordered   10/30/17 0239  Full code  Continuous     10/30/17 0238    Code Status History    Date Active Date Inactive Code Status Order ID Comments User Context   10/08/2011 2205 10/09/2011 1356 Full Code [id number]  Norman Herrlich, NP ED      Home/SNF/Other Home  Chief Complaint Medical [place]  Level of Care/Admitting Diagnosis ED Disposition    ED Disposition Condition Comment   Transfer to Another Facility  Transfer to [facility], bed 505-1, may go now Accepting provider is Dr Nancy Fetter, Attending provider is Dr Darleene Cleaver       Medical History Past Medical History:  Diagnosis Date  . Back pain   . COPD (chronic obstructive pulmonary disease) ([place])   . Esophageal reflux   . Insomnia   . Kidney injury with open wound   . Mood swings     Allergies No Known Allergies  IV Location/Drains/Wounds Patient Lines/Drains/Airways Status   Active Line/Drains/Airways    None          Labs/Imaging Results for orders placed or performed during the hospital encounter of 10/30/17 (from the past 48 hour(s))  Comprehensive metabolic panel     Status: Abnormal   Collection Time: 10/30/17 12:09 AM  Result Value Ref Range   Sodium 143 135 - 145 mmol/L   Potassium 3.9 3.5 - 5.1 mmol/L   Chloride 105 101 - 111 mmol/L   CO2 26 22 - 32 mmol/L   Glucose, Bld 127 (H) 65 - 99 mg/dL   BUN 13 6 - 20 mg/dL   Creatinine, Ser 0.59 0.44 - 1.00 mg/dL   Calcium 9.9 8.9 - 10.3 mg/dL   Total Protein 8.3 (H) 6.5 - 8.1 g/dL   Albumin 4.9 3.5 - 5.0 g/dL   AST 16 15 - 41 U/L   ALT 16 14 - 54 U/L   Alkaline Phosphatase 75 38 - 126 U/L   Total Bilirubin 0.4 0.3 - 1.2 mg/dL   GFR calc non Af Amer >60 >60 mL/min   GFR calc Af Amer >60 >60 mL/min    Comment: (NOTE) The eGFR has been calculated using the CKD EPI equation. This  calculation has not been validated in all clinical situations. eGFR's persistently <60 mL/min signify possible Chronic Kidney Disease.    Anion gap 12 5 - 15    Comment: Performed at [facility], [place] [address] [place], [place] [address]  cbc     Status: Abnormal   Collection Time: 10/30/17 12:09 AM  Result Value Ref Range   WBC 11.8 (H) 4.0 - 10.5 K/uL   RBC 4.83 3.87 - 5.11 MIL/uL   Hemoglobin 14.7 12.0 - 15.0 g/dL   HCT 44.1 36.0 - 46.0 %   MCV 91.3 78.0 - 100.0 fL   MCH 30.4 26.0 - 34.0 pg   MCHC 33.3 30.0 - 36.0 g/dL   RDW 13.0 11.5 - 15.5 %   Platelets 328 150 - 400 K/uL    Comment: Performed at [facility], [place] [address] [place], [place] [address]  Ethanol     Status: None   Collection Time: 10/30/17 12:10 AM  Result Value Ref Range   Alcohol, Ethyl (B) <10 <10 mg/dL  Comment:        LOWEST DETECTABLE LIMIT FOR SERUM ALCOHOL IS 10 mg/dL FOR MEDICAL PURPOSES ONLY Performed at [place] [address], [place], [place] [address]   Salicylate level     Status: None   Collection Time: 10/30/17 12:10 AM  Result Value Ref Range   Salicylate Lvl <3.8 2.8 - 30.0 mg/dL    Comment: Performed at [facility], [place] [address], [place], [place] [address]  Acetaminophen level     Status: Abnormal   Collection Time: 10/30/17 12:10 AM  Result Value Ref Range   Acetaminophen (Tylenol), Serum <10 (L) 10 - 30 ug/mL    Comment:        THERAPEUTIC CONCENTRATIONS VARY SIGNIFICANTLY. A RANGE OF 10-30 ug/mL MAY BE AN EFFECTIVE CONCENTRATION FOR MANY PATIENTS. HOWEVER, SOME ARE BEST TREATED AT CONCENTRATIONS OUTSIDE THIS RANGE. ACETAMINOPHEN CONCENTRATIONS >150 ug/mL AT 4 HOURS AFTER INGESTION AND >50 ug/mL AT 12 HOURS AFTER INGESTION ARE OFTEN ASSOCIATED WITH TOXIC REACTIONS. Performed at [facility], [place] [address] [place], [place] [address]   I-Stat beta hCG blood, ED      Status: None   Collection Time: 10/30/17 12:14 AM  Result Value Ref Range   I-stat hCG, quantitative <5.0 <5 mIU/mL   Comment 3            Comment:   GEST. AGE      CONC.  (mIU/mL)   <=1 WEEK        5 - 50     2 WEEKS       50 - 500     3 WEEKS       100 - 10,000     4 WEEKS     1,000 - 30,000        FEMALE AND NON-PREGNANT FEMALE:     LESS THAN 5 mIU/mL   Rapid urine drug screen (hospital performed)     Status: Abnormal   Collection Time: 10/30/17 12:24 AM  Result Value Ref Range   Opiates NONE DETECTED NONE DETECTED   Cocaine POSITIVE (A) NONE DETECTED   Benzodiazepines NONE DETECTED NONE DETECTED   Amphetamines NONE DETECTED NONE DETECTED   Tetrahydrocannabinol POSITIVE (A) NONE DETECTED   Barbiturates NONE DETECTED NONE DETECTED    Comment: (NOTE) DRUG SCREEN FOR MEDICAL PURPOSES ONLY.  IF CONFIRMATION IS NEEDED FOR ANY PURPOSE, NOTIFY LAB WITHIN 5 DAYS. LOWEST DETECTABLE LIMITS FOR URINE DRUG SCREEN Drug Class                     Cutoff (ng/mL) Amphetamine and metabolites    1000 Barbiturate and metabolites    200 Benzodiazepine                 678 Tricyclics and metabolites     300 Opiates and metabolites        300 Cocaine and metabolites        300 THC                            50 Performed at [facility], [place] [address] [place], [place] [address]    No results found.  Pending Labs Unresulted Labs (From admission, onward)   None      Vitals/Pain Today's Vitals   10/30/17 0615 10/30/17 0824 10/30/17 0918 10/30/17 0956  BP: (!) 110/59  134/82   Pulse: 91  Resp: 16     Temp: 99.5 F (37.5 C)     TempSrc: Oral     SpO2: 97%     Weight:      Height:      PainSc:  10-Worst pain ever  Asleep    Isolation Precautions No active isolations  Medications Medications  ondansetron (ZOFRAN-ODT) disintegrating tablet 4 mg (4 mg Oral Not Given 10/30/17 0259)  QUEtiapine (SEROQUEL) tablet 300 mg (300 mg Oral Given 10/30/17 0257)   cloNIDine (CATAPRES) tablet 0.1 mg (0.1 mg Oral Given 10/30/17 1018)    Followed by  cloNIDine (CATAPRES) tablet 0.1 mg (has no administration in time range)    Followed by  cloNIDine (CATAPRES) tablet 0.1 mg (has no administration in time range)  dicyclomine (BENTYL) tablet 20 mg (20 mg Oral Given 10/30/17 0909)  hydrOXYzine (ATARAX/VISTARIL) tablet 25 mg (25 mg Oral Given 10/30/17 0909)  loperamide (IMODIUM) capsule 2-4 mg (has no administration in time range)  methocarbamol (ROBAXIN) tablet 500 mg (500 mg Oral Given 10/30/17 0910)  naproxen (NAPROSYN) tablet 500 mg (500 mg Oral Given 10/30/17 0910)  ondansetron (ZOFRAN-ODT) disintegrating tablet 4 mg (4 mg Oral Given 10/30/17 0910)  acetaminophen (TYLENOL) tablet 1,000 mg (1,000 mg Oral Given 10/30/17 0257)    Mobility walks

## 2017-10-30 NOTE — Tx Team (Signed)
Initial Treatment Plan 10/30/2017 3:16 PM Nancy Decker YTK:[id number]    PATIENT STRESSORS: Financial difficulties Substance abuse   PATIENT STRENGTHS: Capable of independent living Communication skills Supportive family/friends   PATIENT IDENTIFIED PROBLEMS: Depression  Suicidal ideation  Substance abuse  "I want help with my depression"  "Being sober"             DISCHARGE CRITERIA:  Improved stabilization in mood, thinking, and/or behavior Verbal commitment to aftercare and medication compliance Withdrawal symptoms are absent or subacute and managed without 24-hour nursing intervention  PRELIMINARY DISCHARGE PLAN: Outpatient therapy Medication management  PATIENT/FAMILY INVOLVEMENT: This treatment plan has been presented to and reviewed with the patient, Nancy Decker.  The patient and family have been given the opportunity to ask questions and make suggestions.  Windell Moment, RN 10/30/2017, 3:16 PM

## 2017-10-30 NOTE — ED Notes (Signed)
Pt resting in bed at this time. Sitter at bedside.

## 2017-10-30 NOTE — ED Notes (Signed)
DAVE TTS AT BEDSIDE. PT HAS SIGNED VOL FOR TRANSFER.
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
## 2017-10-30 NOTE — BHH Counselor (Signed)
Per Minette Headland, RN pt has stopped throwing up.  Vertell Novak, MS, [facility], [facility] Triage Specialist [phone]

## 2017-10-30 NOTE — ED Provider Notes (Signed)
TIME SEEN: 2:38 AM  CHIEF COMPLAINT: Suicidal thoughts  HPI: Patient is a 47 year old female with history of chronic pain, COPD, depression who presents emergency department with complaints of suicidal thoughts.  Reports that she has been suicidal for 6 months but has progressively gotten worse.  She states "I want to die".  She does have access to a firearm at home.  She states she is trying to get her life together because she is about to take custody of her grandson because her children are heroin addicts.  She does report that she abuses Roxicodone and buys them off the street because of her chronic pain.  She did take 2 Roxicodone 30 mg tablets around 6 PM last night for her pain and states this caused her to have nausea and vomiting.  She states she did not take these medications to try to kill herself.  She states she also feels paranoid like she is hearing people outside of her house.  No visual or auditory hallucinations.  No HI.  She has no abdominal pain or diarrhea.  No fever.  No chest pain or shortness of breath.  States that narcotics sometimes make her nauseated.  ROS: See HPI Constitutional: no fever  Eyes: no drainage  ENT: no runny nose   Cardiovascular:  no chest pain  Resp: no SOB  GI:  vomiting GU: no dysuria Integumentary: no rash  Allergy: no hives  Musculoskeletal: no leg swelling  Neurological: no slurred speech ROS otherwise negative  PAST MEDICAL HISTORY/PAST SURGICAL HISTORY:  Past Medical History:  Diagnosis Date  . Back pain   . COPD (chronic obstructive pulmonary disease) ([place])   . Esophageal reflux   . Insomnia   . Kidney injury with open wound   . Mood swings     MEDICATIONS:  Prior to Admission medications   Medication Sig Start Date End Date Taking? Authorizing Provider  QUEtiapine (SEROQUEL) 300 MG tablet Take 300 mg by mouth at bedtime.    Yes [provider]    ALLERGIES:  No Known Allergies  SOCIAL HISTORY:  Social History    Tobacco Use  . Smoking status: Current Every Day Smoker    Packs/day: 0.50    Types: Cigarettes  . Smokeless tobacco: Never Used  Substance Use Topics  . Alcohol use: Never    Frequency: Never    FAMILY HISTORY: Family History  Problem Relation Age of Onset  . Hypertension Father   . Diabetes Father   . Skin cancer Mother   . Ovarian cancer Mother   . Coronary artery disease Mother   . Throat cancer Mother   . Stroke Sister 57  . Heart murmur Sister 21    EXAM: BP (!) 157/82 (BP Location: Right Arm)   Pulse 81   Temp 98.2 F (36.8 C) (Oral)   Resp 20   Ht 5' (1.524 m)   Wt 64.4 kg (142 lb)   SpO2 97%   BMI 27.73 kg/m  CONSTITUTIONAL: Alert and oriented and responds appropriately to questions. Well-appearing; well-nourished HEAD: Normocephalic EYES: Conjunctivae clear, pupils appear equal, EOMI ENT: normal nose; moist mucous membranes NECK: Supple, no meningismus, no nuchal rigidity, no LAD  CARD: RRR; S1 and S2 appreciated; no murmurs, no clicks, no rubs, no gallops RESP: Normal chest excursion without splinting or tachypnea; breath sounds clear and equal bilaterally; no wheezes, no rhonchi, no rales, no hypoxia or respiratory distress, speaking full sentences ABD/GI: Normal bowel sounds; non-distended; soft, non-tender, no rebound, no  guarding, no peritoneal signs, no hepatosplenomegaly BACK:  The back appears normal and is non-tender to palpation, there is no CVA tenderness EXT: Normal ROM in all joints; non-tender to palpation; no edema; normal capillary refill; no cyanosis, no calf tenderness or swelling    SKIN: Normal color for age and race; warm; no rash NEURO: Moves all extremities equally PSYCH: Patient has suicidal thoughts without plan.  No HI or hallucinations.  MEDICAL DECISION MAKING: Patient here with suicidal thoughts.  She is vomiting after taking oxycodone today.  Denies that this was an intentional overdose.  She has history of narcotic abuse.   Will obtain screening labs and urine.  Will give Zofran for symptomatic relief.  Abdominal exam is benign.  Doubt colitis, appendicitis, bowel obstruction, diverticulitis, cholecystitis, pancreatitis.  ED PROGRESS: Screening labs unremarkable.  Drug screen positive for cocaine and THC.  TTS recommends inpatient treatment and they have a bed at behavioral health [facility] once patient has stopped vomiting.  Abdominal exam remains benign.  6:20 AM Confirmed with patient's nurse that she is no longer vomiting.  This time she is medically cleared and can be transported to behavioral health [facility].  Nurse will update TTS.   I reviewed all nursing notes, vitals, pertinent previous records, EKGs, lab and urine results, imaging (as available).    Ward, Delice Bison, DO 10/30/17 [phone]
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
## 2017-10-30 NOTE — Progress Notes (Signed)
Patient ID: Nancy Decker, female   DOB: [birth year], 47 y.o.   MRN: [id number]  Pt currently presents with a flat affect and anxious behavior. Pt reports increasing signs and symptoms of withdrawal. Presents with signs and symptoms of withdrawal including body aches, fatigue, sweating and restless legs. Pt asks Probation officer "When will I see the doctor? Do you think he will let me leave." Reports that she has been on Roxy's since 1994 and has only detoxed once "about 10 years ago."   Pt provided with medications per providers orders. Pt's labs and vitals were monitored throughout the night. COWS score is a 10 at midnight tonight. Pt given a 1:1 about emotional and mental status. Pt supported and encouraged to express concerns and questions. Pt educated on medications. Provider notified of increase in patients withdrawal symptoms, see MAR.   Pt's safety ensured with 15 minute and environmental checks. Pt currently denies SI/HI and A/V hallucinations. Pt verbally agrees to seek staff if SI/HI or A/VH occurs and to consult with staff before acting on any harmful thoughts. Pt in no current distress, seen tossing and turning in bed. Will continue POC.

## 2017-10-30 NOTE — ED Notes (Addendum)
PT GIVEN SPRITE AND CRACKERS AND ENCOURAGE CHIPS AND SIPS TO TOLERATE MEDS. PT AWARE OF DELAY WITH TRANSFER UNTIL SYMPTOMS IMPROVE.

## 2017-10-30 NOTE — ED Notes (Signed)
Bed: [facility] Expected date:  Expected time:  Means of arrival:  Comments: 

## 2017-10-30 NOTE — ED Notes (Addendum)
Pt. In burgundy scrubs. Pt. And belongings searched and wanded by security. Pt. has 1 belongings bag. Pt. Belongings locked up in the triage area behind nurses station.

## 2017-10-30 NOTE — Progress Notes (Signed)
[place] Group Notes:  (Nursing/MHT/Case Management/Adjunct)  Date:  10/30/2017  Time:  2100  Type of Therapy:  wrap up group  Participation Level:  Active  Participation Quality:  Appropriate, Attentive, Sharing and Supportive  Affect:  Tearful  Cognitive:  Alert  Insight:  Improving  Engagement in Group:  Engaged  Modes of Intervention:  Clarification, Education and Support  Summary of Progress/Problems:Pt shares that she has had chronic pain for years and she has been overmedicating and abusing Crack in the last 6 months. She reports needing intensive therapy due to the loss of both of her children to prison at this time. Pt feels miserable emotionally, physically, and mentally. Pt shares that she drove herself here without telling her husband because she knew what she needed. Pt's reports husband is supportive and wasn't/isn't aware of the crack use.   Shellia Cleverly 10/30/2017, 10:21 PM

## 2017-10-31 DIAGNOSIS — Z91410 Personal history of adult physical and sexual abuse: Secondary | ICD-10-CM

## 2017-10-31 DIAGNOSIS — M549 Dorsalgia, unspecified: Secondary | ICD-10-CM

## 2017-10-31 DIAGNOSIS — M2550 Pain in unspecified joint: Secondary | ICD-10-CM

## 2017-10-31 DIAGNOSIS — Z6379 Other stressful life events affecting family and household: Secondary | ICD-10-CM

## 2017-10-31 DIAGNOSIS — G4700 Insomnia, unspecified: Secondary | ICD-10-CM

## 2017-10-31 DIAGNOSIS — R440 Auditory hallucinations: Secondary | ICD-10-CM

## 2017-10-31 DIAGNOSIS — R450 Nervousness: Secondary | ICD-10-CM

## 2017-10-31 DIAGNOSIS — F419 Anxiety disorder, unspecified: Secondary | ICD-10-CM

## 2017-10-31 DIAGNOSIS — F17210 Nicotine dependence, cigarettes, uncomplicated: Secondary | ICD-10-CM

## 2017-10-31 DIAGNOSIS — F332 Major depressive disorder, recurrent severe without psychotic features: Principal | ICD-10-CM

## 2017-10-31 DIAGNOSIS — F4010 Social phobia, unspecified: Secondary | ICD-10-CM

## 2017-10-31 LAB — URINALYSIS, ROUTINE W REFLEX MICROSCOPIC
GLUCOSE, UA: NEGATIVE mg/dL
KETONES UR: NEGATIVE mg/dL
LEUKOCYTES UA: NEGATIVE
NITRITE: NEGATIVE
PH: 5 (ref 5.0–8.0)
Protein, ur: NEGATIVE mg/dL
Specific Gravity, Urine: 1.033 — ABNORMAL HIGH (ref 1.005–1.030)

## 2017-10-31 MED ORDER — MIRTAZAPINE 7.5 MG PO TABS
7.5000 mg | ORAL_TABLET | Freq: Every day | ORAL | Status: DC
  Administered 2017-10-31: 7.5 mg via ORAL
  Filled 2017-10-31 (×3): qty 1

## 2017-10-31 MED ORDER — ONDANSETRON HCL 4 MG PO TABS
8.0000 mg | ORAL_TABLET | Freq: Three times a day (TID) | ORAL | Status: DC | PRN
  Administered 2017-10-31: 8 mg via ORAL
  Filled 2017-10-31: qty 2
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
## 2017-10-31 NOTE — Progress Notes (Signed)
D. Pt presents with an anxious affect and depressed, anxious behavior. Pt endorsing moderate withdrawal symptoms, myalgia, anxiety and stomach cramps this am. Pt remained in bed for most of the am-  observed in dayroom playing cards with peers for a short time before lunch. Pt currently denies SI/HI and AV hallucinations. A. Labs and vitals monitored. Pt compliant with medications. Pt supported emotionally and encouraged to express concerns and ask questions.   R. Pt remains safe with 15 minute checks. Will continue POC.

## 2017-10-31 NOTE — [facility] Group Notes (Signed)
[place] Group Notes:  (Nursing)  Date:  10/31/2017  Time:1:30 PM Type of Therapy:  Nurse Education  Participation Level:  Did Not Attend  Participation Quality:  did not attend  Affect:  didnt attend  Cognitive:  did not attend  Insight:  None  Engagement in Group:  None  Modes of Intervention:  did not attend  Summary of Progress/Problems: Patient did not attend  - Waymond Cera 10/31/2017, 2:41 PM

## 2017-10-31 NOTE — Progress Notes (Signed)
Patient did attend the evening speaker AA meeting.  

## 2017-10-31 NOTE — [facility] Group Notes (Signed)
[place] LCSW Group Therapy Note  Date/Time:  10/31/2017 9:00  Type of Therapy and Topic:  Group Therapy:  Healthy and Unhealthy Supports  Participation Level:  Did Not Attend   Description of Group:  Patients in this group were introduced to the idea of adding a variety of healthy supports to address the various needs in their lives.Patients discussed what additional healthy supports could be helpful in their recovery and wellness after discharge in order to prevent future hospitalizations.   An emphasis was placed on using counselor, doctor, therapy groups, 12-step groups, and problem-specific support groups to expand supports.  They also worked as a group on developing a specific plan for several patients to deal with unhealthy supports through [place], psychoeducation with loved ones, and even termination of relationships.   Therapeutic Goals:              1)  discuss importance of adding supports to stay well once out of the hospital             2)  compare healthy versus unhealthy supports and identify some examples of each             3)  generate ideas and descriptions of healthy supports that can be added             4)  offer mutual support about how to address unhealthy supports             5)  encourage active participation in and adherence to discharge plan               Summary of Patient Progress:   Pt was invited to attend group but chose not to attend. CSW will continue to encourage pt to attend group throughout their admission.    Therapeutic Modalities:   Motivational Interviewing Brief Solution-Focused Therapy  Alden Hipp, MSW, LCSW 10/31/2017 10:00 AM
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
## 2017-11-01 DIAGNOSIS — F1410 Cocaine abuse, uncomplicated: Secondary | ICD-10-CM

## 2017-11-01 DIAGNOSIS — F1120 Opioid dependence, uncomplicated: Secondary | ICD-10-CM

## 2017-11-01 DIAGNOSIS — F1210 Cannabis abuse, uncomplicated: Secondary | ICD-10-CM

## 2017-11-01 MED ORDER — DULOXETINE HCL 30 MG PO CPEP
30.0000 mg | ORAL_CAPSULE | Freq: Every day | ORAL | Status: DC
  Administered 2017-11-01 – 2017-11-02 (×2): 30 mg via ORAL
  Filled 2017-11-01 (×4): qty 1

## 2017-11-01 MED ORDER — LORAZEPAM 1 MG PO TABS
ORAL_TABLET | ORAL | Status: AC
  Filled 2017-11-01: qty 1

## 2017-11-01 MED ORDER — LIDOCAINE 5 % EX PTCH
1.0000 | MEDICATED_PATCH | CUTANEOUS | Status: DC
  Administered 2017-11-01: 1 via TRANSDERMAL
  Filled 2017-11-01 (×3): qty 1

## 2017-11-01 MED ORDER — MIRTAZAPINE 15 MG PO TABS
15.0000 mg | ORAL_TABLET | Freq: Every day | ORAL | Status: DC
Start: 2017-11-01 — End: 2017-11-03
  Administered 2017-11-01 – 2017-11-02 (×2): 15 mg via ORAL
  Filled 2017-11-01 (×4): qty 1

## 2017-11-01 MED ORDER — LORAZEPAM 1 MG PO TABS
1.0000 mg | ORAL_TABLET | Freq: Once | ORAL | Status: AC
  Administered 2017-11-01: 1 mg via ORAL

## 2017-11-01 NOTE — Progress Notes (Signed)
[facility] MD Progress Note  11/01/2017 11:00 AM Nancy Decker  MRN:  [id number]   Subjective:  Patient reports that she is still hurting very bad and is wanting pain medication. She states understanding about the legal issues of prescribing her controlled substances with her situation. She jsut wants some of the pain to go away. She reports that she still feels very depressed with her situation at home and her children in trouble over heroin. She reports that her anxiety has been under control for the most part. She denies any SI/HI/AVH and contracts for safety.   Objective: Patient's chart and findings reviewed and discussed with treatment team. Patient presents in the day room attending group. She appears depressed and becomes tearful discussing her life stressors. She has been cooperative and has stated she wants help. We have been encouraging patient to go back to a pain management clinic and stop abusing substances. Due to continued depression will increase Remeron 145 mg QHS and will start Cymbalta 30 mg Daily for depression and chronic pain.   Principal Problem: Severe recurrent major depression without psychotic features ([place]) Diagnosis:   Patient Active Problem List   Diagnosis Date Noted  . Opiate dependence ([place]) [F11.20] 11/01/2017  . Major depressive disorder, recurrent episode, severe ([place]) [F33.2] 10/30/2017  . Severe recurrent major depression without psychotic features ([place]) [F33.2] 10/30/2017   Total Time spent with patient: 30 minutes  Past Psychiatric History: See H&P  Past Medical History:  Past Medical History:  Diagnosis Date  . Back pain   . COPD (chronic obstructive pulmonary disease) ([place])   . Esophageal reflux   . Insomnia   . Kidney injury with open wound   . Mood swings     Past Surgical History:  Procedure Laterality Date  . BACK SURGERY    . bladder tacting    . COLOSTOMY    . LEG SURGERY    . TOTAL ABDOMINAL HYSTERECTOMY    . WISDOM TOOTH EXTRACTION      Family History:  Family History  Problem Relation Age of Onset  . Hypertension Father   . Diabetes Father   . Skin cancer Mother   . Ovarian cancer Mother   . Coronary artery disease Mother   . Throat cancer Mother   . Stroke Sister 36  . Heart murmur Sister 90   Family Psychiatric  History: See H&P Social History:  Social History   Substance and Sexual Activity  Alcohol Use Never  . Frequency: Never     Social History   Substance and Sexual Activity  Drug Use Yes  . Types: Oxycodone, Marijuana, "Crack" cocaine    Social History   Socioeconomic History  . Marital status: Married    Spouse name: Not on file  . Number of children: Not on file  . Years of education: Not on file  . Highest education level: Not on file  Occupational History  . Not on file  Social Needs  . Financial resource strain: Not on file  . Food insecurity:    Worry: Not on file    Inability: Not on file  . Transportation needs:    Medical: Not on file    Non-medical: Not on file  Tobacco Use  . Smoking status: Current Every Day Smoker    Packs/day: 0.50    Types: Cigarettes  . Smokeless tobacco: Never Used  Substance and Sexual Activity  . Alcohol use: Never    Frequency: Never  . Drug use: Yes  Types: Oxycodone, Marijuana, "Crack" cocaine  . Sexual activity: Yes    Birth control/protection: Surgical  Lifestyle  . Physical activity:    Days per week: Not on file    Minutes per session: Not on file  . Stress: Not on file  Relationships  . Social connections:    Talks on phone: Not on file    Gets together: Not on file    Attends religious service: Not on file    Active member of club or organization: Not on file    Attends meetings of clubs or organizations: Not on file    Relationship status: Not on file  Other Topics Concern  . Not on file  Social History Narrative  . Not on file   Additional Social History:                         Sleep:  Fair  Appetite:  Fair  Current Medications: Current Facility-Administered Medications  Medication Dose Route Frequency Provider Last Rate Last Dose  . acetaminophen (TYLENOL) tablet 650 mg  650 mg Oral Q6H PRN Lindon Romp A, NP   650 mg at 10/31/17 1728  . alum & mag hydroxide-simeth (MAALOX/MYLANTA) 200-200-20 MG/5ML suspension 30 mL  30 mL Oral Q4H PRN Lindon Romp A, NP      . cloNIDine (CATAPRES) tablet 0.1 mg  0.1 mg Oral QID Lindon Romp A, NP   0.1 mg at 11/01/17 5852   Followed by  . cloNIDine (CATAPRES) tablet 0.1 mg  0.1 mg Oral BH-qamhs Rozetta Nunnery, NP       Followed by  . [START ON 11/04/2017] cloNIDine (CATAPRES) tablet 0.1 mg  0.1 mg Oral QAC breakfast Lindon Romp A, NP      . dicyclomine (BENTYL) tablet 20 mg  20 mg Oral Q6H PRN Lindon Romp A, NP   20 mg at 11/01/17 0908  . DULoxetine (CYMBALTA) DR capsule 30 mg  30 mg Oral Daily Tj Kitchings, Lowry Ram, FNP      . hydrOXYzine (ATARAX/VISTARIL) tablet 25 mg  25 mg Oral Q6H PRN Lindon Romp A, NP   25 mg at 11/01/17 0908  . loperamide (IMODIUM) capsule 2-4 mg  2-4 mg Oral PRN Lindon Romp A, NP      . magnesium hydroxide (MILK OF MAGNESIA) suspension 30 mL  30 mL Oral Daily PRN Lindon Romp A, NP   30 mL at 11/01/17 0905  . methocarbamol (ROBAXIN) tablet 500 mg  500 mg Oral Q8H PRN Lindon Romp A, NP   500 mg at 11/01/17 0908  . mirtazapine (REMERON) tablet 15 mg  15 mg Oral QHS Sheria Rosello, Lowry Ram, FNP      . naproxen (NAPROSYN) tablet 500 mg  500 mg Oral BID PRN Lindon Romp A, NP   500 mg at 10/31/17 2134  . nicotine (NICODERM CQ - dosed in mg/24 hours) patch 21 mg  21 mg Transdermal Daily Pennelope Bracken, MD   21 mg at 11/01/17 0905  . ondansetron (ZOFRAN) tablet 8 mg  8 mg Oral Q8H PRN Sharma Covert, MD   8 mg at 10/31/17 2133  . QUEtiapine (SEROQUEL) tablet 300 mg  300 mg Oral QHS Lindon Romp A, NP   300 mg at 10/31/17 2133    Lab Results:  Results for orders placed or performed during the hospital encounter of  10/30/17 (from the past 48 hour(s))  Urinalysis, Routine w reflex microscopic     Status:  Abnormal   Collection Time: 10/30/17  2:58 PM  Result Value Ref Range   Color, Urine YELLOW YELLOW   APPearance CLEAR CLEAR   Specific Gravity, Urine 1.033 (H) 1.005 - 1.030   pH 5.0 5.0 - 8.0   Glucose, UA NEGATIVE NEGATIVE mg/dL   Hgb urine dipstick SMALL (A) NEGATIVE   Bilirubin Urine SMALL (A) NEGATIVE   Ketones, ur NEGATIVE NEGATIVE mg/dL   Protein, ur NEGATIVE NEGATIVE mg/dL   Nitrite NEGATIVE NEGATIVE   Leukocytes, UA NEGATIVE NEGATIVE   RBC / HPF 0-5 0 - 5 RBC/hpf   WBC, UA 0-5 0 - 5 WBC/hpf   Bacteria, UA FEW (A) NONE SEEN   Squamous Epithelial / LPF 0-5 (A) NONE SEEN   Mucus PRESENT    Ca Oxalate Crys, UA PRESENT     Comment: Performed at [facility], [place] [address], [place], [place] [address]    Blood Alcohol level:  Lab Results  Component Value Date   ETH <10 10/30/2017   [facility]  02/13/2008    <5        LOWEST DETECTABLE LIMIT FOR SERUM ALCOHOL IS 11 mg/dL FOR MEDICAL PURPOSES ONLY    Metabolic Disorder Labs: No results found for: HGBA1C, MPG No results found for: PROLACTIN No results found for: CHOL, TRIG, HDL, CHOLHDL, VLDL, LDLCALC  Physical Findings: AIMS: Facial and Oral Movements Muscles of Facial Expression: None, normal Lips and Perioral Area: None, normal Jaw: None, normal Tongue: None, normal,Extremity Movements Upper (arms, wrists, hands, fingers): None, normal Lower (legs, knees, ankles, toes): None, normal, Trunk Movements Neck, shoulders, hips: None, normal, Overall Severity Severity of abnormal movements (highest score from questions above): None, normal Incapacitation due to abnormal movements: None, normal Patient's awareness of abnormal movements (rate only patient's report): No Awareness, Dental Status Current problems with teeth and/or dentures?: No Does patient usually wear dentures?: No  CIWA:    COWS:  COWS Total Score:  6  Musculoskeletal: Strength & Muscle Tone: within normal limits Gait & Station: normal Patient leans: N/A  Psychiatric Specialty Exam: Physical Exam  Nursing note and vitals reviewed. Constitutional: She is oriented to person, place, and time. She appears well-developed and well-nourished.  Cardiovascular: Normal rate.  Respiratory: Effort normal.  Musculoskeletal: Normal range of motion.  Neurological: She is alert and oriented to person, place, and time.  Skin: Skin is warm.    Review of Systems  Constitutional: Negative.   HENT: Negative.   Eyes: Negative.   Respiratory: Negative.   Cardiovascular: Negative.   Gastrointestinal: Negative.   Genitourinary: Negative.   Musculoskeletal: Negative.   Skin: Negative.   Neurological: Negative.   Endo/Heme/Allergies: Negative.   Psychiatric/Behavioral: Positive for depression and substance abuse. Negative for hallucinations and suicidal ideas. The patient is nervous/anxious.     Blood pressure 101/66, pulse 90, temperature 98.3 F (36.8 C), temperature source Oral, resp. rate 17, height 4\' 8"  (1.422 m), weight 64.9 kg (143 lb).Body mass index is 32.06 kg/m.  General Appearance: Disheveled  Eye Contact:  Good  Speech:  Clear and Coherent and Normal Rate  Volume:  Decreased  Mood:  Depressed and Hopeless  Affect:  Depressed and Tearful  Thought Process:  Goal Directed and Descriptions of Associations: Intact  Orientation:  Full (Time, Place, and Person)  Thought Content:  WDL  Suicidal Thoughts:  No  Homicidal Thoughts:  No  Memory:  Immediate;   Good Recent;   Good Remote;   Good  Judgement:  Fair  Insight:  Fair  Psychomotor Activity:  Normal  Concentration:  Concentration: Good and Attention Span: Good  Recall:  Good  Fund of Knowledge:  Good  Language:  Good  Akathisia:  No  Handed:  Right  AIMS (if indicated):     Assets:  Communication Skills Desire for Improvement Financial  Resources/Insurance Housing Social Support  ADL's:  Intact  Cognition:  WNL  Sleep:  Number of Hours: 3.5   Problems Addressed: MDD severe Opiate dependence  Treatment Plan Summary: Daily contact with patient to assess and evaluate symptoms and progress in treatment, Medication management and Plan is to:  -Start Cymbalta 30 mg PO Daily for mood control and chronic pain -Increase Remeron 15 mg PO QHS for mood stability -Continue Clonidine Detox Protocol -Continue Vistaril 25 mg Q6H PRN for anxiety -Continue Seroquel 300 mg PO QHS for mood stability -Encourage group therapy participation  Lewis Shock, FNP 11/01/2017, 11:00 AM

## 2017-11-01 NOTE — Progress Notes (Signed)
D: Patient continues to complain of withdrawal symptoms.  She is experiencing agitation, runny nose, cramping, nausea and irritability.  Her sleep and appetite are fair; her energy level is low and her concentration is poor.  She denies any thoughts of self harm.  Her goal today is to "get out of here."  Patient has sad, depressed mood, however, she rates her depression and hopelessness as a 1. She also is exhibiting some irritability.  Patient has been visible in the milieu and she is observed interacting with her peers.  Patient is having moderate withdrawal symptoms. She denies any auditory hallucinations (having upon admission).    A: Continue to monitor medication management and MD orders.  Safety checks continued every 15 minutes per protocol.  Offer support and encouragement as needed.  R: Patient is receptive to staff; her behavior is appropriate.

## 2017-11-01 NOTE — Progress Notes (Signed)
D.  Pt pleasant but suffering from moderate withdrawal symptoms at this time (see COW flowsheet).  Pt did attend evening AA group, and was observed engaged in minimal but appropriate interaction with peers on the unit.  Pt denies SI/HI/AVH at this time.  A. Support and encouragement offered, medication given as ordered.  R.  Pt remains safe on the unit, will continue to monitor.

## 2017-11-01 NOTE — Plan of Care (Signed)
  Problem: Activity: Goal: Interest or engagement in activities will improve Outcome: Progressing Goal: Sleeping patterns will improve Outcome: Progressing   

## 2017-11-01 NOTE — Tx Team (Signed)
Interdisciplinary Treatment and Diagnostic Plan Update  11/01/2017 Time of Session: [place] MRN: [id number]  Principal Diagnosis: Severe recurrent major depression without psychotic features [facility])  Secondary Diagnoses: Principal Problem:   Severe recurrent major depression without psychotic features ([place])   Current Medications:  Current Facility-Administered Medications  Medication Dose Route Frequency Provider Last Rate Last Dose  . acetaminophen (TYLENOL) tablet 650 mg  650 mg Oral Q6H PRN Lindon Romp A, NP   650 mg at 10/31/17 1728  . alum & mag hydroxide-simeth (MAALOX/MYLANTA) 200-200-20 MG/5ML suspension 30 mL  30 mL Oral Q4H PRN Lindon Romp A, NP      . cloNIDine (CATAPRES) tablet 0.1 mg  0.1 mg Oral QID Lindon Romp A, NP   0.1 mg at 11/01/17 1324   Followed by  . cloNIDine (CATAPRES) tablet 0.1 mg  0.1 mg Oral BH-qamhs Rozetta Nunnery, NP       Followed by  . [START ON 11/04/2017] cloNIDine (CATAPRES) tablet 0.1 mg  0.1 mg Oral QAC breakfast Lindon Romp A, NP      . dicyclomine (BENTYL) tablet 20 mg  20 mg Oral Q6H PRN Lindon Romp A, NP   20 mg at 11/01/17 0908  . hydrOXYzine (ATARAX/VISTARIL) tablet 25 mg  25 mg Oral Q6H PRN Lindon Romp A, NP   25 mg at 11/01/17 0908  . loperamide (IMODIUM) capsule 2-4 mg  2-4 mg Oral PRN Lindon Romp A, NP      . magnesium hydroxide (MILK OF MAGNESIA) suspension 30 mL  30 mL Oral Daily PRN Lindon Romp A, NP   30 mL at 11/01/17 0905  . methocarbamol (ROBAXIN) tablet 500 mg  500 mg Oral Q8H PRN Lindon Romp A, NP   500 mg at 11/01/17 0908  . mirtazapine (REMERON) tablet 7.5 mg  7.5 mg Oral QHS Sharma Covert, MD   7.5 mg at 10/31/17 2200  . naproxen (NAPROSYN) tablet 500 mg  500 mg Oral BID PRN Rozetta Nunnery, NP   500 mg at 10/31/17 2134  . nicotine (NICODERM CQ - dosed in mg/24 hours) patch 21 mg  21 mg Transdermal Daily Pennelope Bracken, MD   21 mg at 11/01/17 0905  . ondansetron (ZOFRAN) tablet 8 mg  8 mg Oral  Q8H PRN Sharma Covert, MD   8 mg at 10/31/17 2133  . QUEtiapine (SEROQUEL) tablet 300 mg  300 mg Oral QHS Lindon Romp A, NP   300 mg at 10/31/17 2133   PTA Medications: Medications Prior to Admission  Medication Sig Dispense Refill Last Dose  . QUEtiapine (SEROQUEL) 300 MG tablet Take 300 mg by mouth at bedtime.    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   Physician Treatment Plan for Primary Diagnosis: Severe recurrent major depression without psychotic features ([place]) Long Term Goal(s): Improvement in symptoms so as ready for discharge Improvement in symptoms so as ready for discharge   Short Term Goals: Ability to identify changes in lifestyle to reduce recurrence of condition will improve Ability to verbalize feelings will improve Ability to disclose and discuss suicidal ideas Ability to demonstrate self-control will improve Ability to identify and develop effective coping behaviors will improve Compliance with prescribed medications will improve Ability to identify triggers associated with substance  abuse/mental health issues will improve  Medication Management: Evaluate patient's response, side effects, and tolerance of medication regimen.  Therapeutic Interventions: 1 to 1 sessions, Unit Group sessions and Medication administration.  Evaluation of Outcomes: Progressing  Physician Treatment Plan for Secondary Diagnosis: Principal Problem:   Severe recurrent major depression without psychotic features ([place])  Long Term Goal(s): Improvement in symptoms so as ready for discharge Improvement in symptoms so as ready for discharge   Short Term Goals: Ability to identify changes in  lifestyle to reduce recurrence of condition will improve Ability to verbalize feelings will improve Ability to disclose and discuss suicidal ideas Ability to demonstrate self-control will improve Ability to identify and develop effective coping behaviors will improve Compliance with prescribed medications will improve Ability to identify triggers associated with substance abuse/mental health issues will improve     Medication Management: Evaluate patient's response, side effects, and tolerance of medication regimen.  Therapeutic Interventions: 1 to 1 sessions, Unit Group sessions and Medication administration.  Evaluation of Outcomes: Progressing   RN Treatment Plan for Primary Diagnosis: Severe recurrent major depression without psychotic features ([place]) Long Term Goal(s): Knowledge of disease and therapeutic regimen to maintain health will improve  Short Term Goals: Ability to remain free from injury will improve, Ability to demonstrate self-control, Ability to verbalize feelings will improve and Ability to disclose and discuss suicidal ideas  Medication Management: RN will administer medications as ordered by provider, will assess and evaluate patient's response and provide education to patient for prescribed medication. RN will report any adverse and/or side effects to prescribing provider.  Therapeutic Interventions: 1 on 1 counseling sessions, Psychoeducation, Medication administration, Evaluate responses to treatment, Monitor vital signs and CBGs as ordered, Perform/monitor CIWA, COWS, AIMS and Fall Risk screenings as ordered, Perform wound care treatments as ordered.  Evaluation of Outcomes: Progressing   LCSW Treatment Plan for Primary Diagnosis: Severe recurrent major depression without psychotic features ([place]) Long Term Goal(s): Safe transition to appropriate next level of care at discharge, Engage patient in therapeutic group addressing interpersonal concerns.  Short Term  Goals: Engage patient in aftercare planning with referrals and resources, Facilitate patient progression through stages of change regarding substance use diagnoses and concerns and Identify triggers associated with mental health/substance abuse issues  Therapeutic Interventions: Assess for all discharge needs, 1 to 1 time with Social worker, Explore available resources and support systems, Assess for adequacy in community support network, Educate family and significant other(s) on suicide prevention, Complete Psychosocial Assessment, Interpersonal group therapy.  Evaluation of Outcomes: Progressing   Progress in Treatment: Attending groups: No. Participating in groups: No. New to unit. Continuing to assess.  Taking medication as prescribed: Yes. Toleration medication: Yes. Family/Significant other contact made: No, will contact:  pt's husband for collateral information and to complete SPE with pt consent Patient understands diagnosis: Yes. Discussing patient identified problems/goals with staff: Yes. Medical problems stabilized or resolved: Yes. Denies suicidal/homicidal ideation: Yes. Issues/concerns per patient self-inventory: No. Other: n/a   New problem(s) identified: No, Describe:  n/a  New Short Term/Long Term Goal(s): detox, medication management for mood stabilization; elimination of SI thoughts; development of comprehensive mental wellness/sobriety plan.   Patient Goal: "to get help for my physical pain and my depression."   Discharge Plan or Barriers: CSW assessing for appropriate referrals. [place] pamphlet and AA/NA information provided for additional community support.   Reason for Continuation of Hospitalization: Anxiety Depression Medication stabilization Suicidal ideation Withdrawal symptoms  Estimated Length of Stay: Wed, 11/03/17  Attendees: Patient:  Nancy Decker 11/01/2017 9:56 AM  Physician: Dr. Mallie Darting MD; Dr. Nancy Fetter MD 11/01/2017 9:56 AM  Nursing: [name];  Chrys Racer RN 11/01/2017 9:56 AM  RN Care Manager:x 11/01/2017 9:56 AM  Social Worker: Maxie Better, LCSW 11/01/2017 9:56 AM  Recreational Therapist: x 11/01/2017 9:56 AM  Other: Ricky Ala NP; Darnelle Maffucci Money NP 11/01/2017 9:56 AM  Other:  11/01/2017 9:56 AM  Other: 11/01/2017 9:56 AM    Scribe for Treatment Team: Pedro Bay, LCSW 11/01/2017 9:56 AM

## 2017-11-01 NOTE — B[facility] Group Notes (Signed)
LCSW Group Therapy Note   11/01/2017 1:15pm   Type of Therapy and Topic:  Group Therapy:  Overcoming Obstacles   Participation Level:  Minimal   Description of Group:    In this group patients will be encouraged to explore what they see as obstacles to their own wellness and recovery. They will be guided to discuss their thoughts, feelings, and behaviors related to these obstacles. The group will process together ways to cope with barriers, with attention given to specific choices patients can make. Each patient will be challenged to identify changes they are motivated to make in order to overcome their obstacles. This group will be process-oriented, with patients participating in exploration of their own experiences as well as giving and receiving support and challenge from other group members.   Therapeutic Goals: 1. Patient will identify personal and current obstacles as they relate to admission. 2. Patient will identify barriers that currently interfere with their wellness or overcoming obstacles.  3. Patient will identify feelings, thought process and behaviors related to these barriers. 4. Patient will identify two changes they are willing to make to overcome these obstacles:      Summary of Patient Progress   Nancy Decker was attentive and engaged during today's processing group. She shared that her pain is 9/10 and she is continuing to struggle with withdrawals, making it difficult to focus on anything else. Nancy Decker remains agitated, with limited insight.    Therapeutic Modalities:   Cognitive Behavioral Therapy Solution Focused Therapy Motivational Interviewing Relapse Prevention Therapy  Kimber Relic Smart, LCSW 11/01/2017 3:48 PM

## 2017-11-01 NOTE — Progress Notes (Signed)
Patient ID: Nancy Decker, female   DOB: 1970/12/17, 47 y.o.   MRN: [id number]  Pt currently presents with a brighter affect and anxious behavior. Pt reports to writer that their goal is to "share my story and stay off of drugs when I leave, maybe not pain pills but things like crack." Pt reports she was thinking about coming back to help other on the unit is some capacity one day. Signs and symptoms of withdrawal has decreased although she still is experiencing some physical agitation. Pt reports good sleep with current medication regimen.   Pt provided with medications per providers orders. Pt's labs and vitals were monitored throughout the night. Pt given a 1:1 about emotional and mental status. Pt supported and encouraged to express concerns and questions. Pt educated on medications and substance withdrawal.   Pt's safety ensured with 15 minute and environmental checks. Pt currently denies SI/HI and A/V hallucinations. Pt verbally agrees to seek staff if SI/HI or A/VH occurs and to consult with staff before acting on any harmful thoughts. Will continue POC.
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
## 2017-11-01 NOTE — Progress Notes (Signed)
Recreation Therapy Notes  Date: 3.29.19 Time: 9:30 a.m. Location: [address] Dayroom   Group Topic: Stress Management   Goal Area(s) Addresses:  Goal 1.1: To reduce stress  -Patient will report feeling a reduction in stress level  -Patient will identify the importance of stress management  -Patient will participate during stress management group treatment     Intervention: Stress Management   Activity: Guided Imagery- Patients were in a peaceful environment with soft lighting enhancing patients mood. Patients were read a guided imagery script to help decrease stress levels   Education: Stress Management, Discharge Planning.    Education Outcome: Acknowledges edcuation/In group clarification offered/Needs additional education   Clinical Observations/Feedback:: Patient did not attend     Ranell Patrick, Recreation Therapy Intern   Ranell Patrick 11/01/2017 9:12 AM

## 2017-11-02 MED ORDER — DULOXETINE HCL 60 MG PO CPEP
60.0000 mg | ORAL_CAPSULE | Freq: Every day | ORAL | Status: DC
  Administered 2017-11-03: 60 mg via ORAL
  Filled 2017-11-02 (×3): qty 1

## 2017-11-02 MED ORDER — GABAPENTIN 100 MG PO CAPS
100.0000 mg | ORAL_CAPSULE | Freq: Three times a day (TID) | ORAL | Status: DC
  Administered 2017-11-02 – 2017-11-03 (×4): 100 mg via ORAL
  Filled 2017-11-02 (×10): qty 1

## 2017-11-02 MED ORDER — LIDOCAINE 5 % EX PTCH: 1.0000 | MEDICATED_PATCH | Freq: Once | CUTANEOUS | Status: DC

## 2017-11-02 MED ORDER — LIDOCAINE 5 % EX PTCH
1.0000 | MEDICATED_PATCH | Freq: Every day | CUTANEOUS | Status: DC
  Administered 2017-11-02 – 2017-11-03 (×2): 1 via TRANSDERMAL
  Filled 2017-11-02 (×5): qty 1

## 2017-11-02 NOTE — [facility] Group Notes (Signed)
[facility] Group Therapy 11/02/2017 1:15pm  Type of Therapy: Mental Health Association Presentation  Participation Level: Active  Participation Quality: Attentive  Affect: Appropriate  Cognitive: Oriented  Insight: Developing/Improving  Engagement in Therapy: Engaged  Modes of Intervention: Discussion, Education and Socialization  Summary of Progress/Problems: [place] ([place] Speaker came to talk about his personal journey with mental health. The pt processed ways by which to relate to the speaker. [place] speaker provided handouts and educational information pertaining to groups and services offered by the [facility]. Pt was engaged in speaker's presentation and was receptive to resources provided.    Joanne Chars, LCSW 11/02/2017 3:34 PM

## 2017-11-02 NOTE — [facility] Suicide Risk Assessment (Signed)
[facility] INPATIENT:  Family/Significant Other Suicide Prevention Education  Suicide Prevention Education:  Contact Attempts: Dorthula Nettles, husband [phone])  has been identified by the patient as the family member/significant other with whom the patient will be residing, and identified as the person(s) who will aid the patient in the event of a mental health crisis.  With written consent from the patient, two attempts were made to provide suicide prevention education, prior to and/or following the patient's discharge.  We were unsuccessful in providing suicide prevention education.  A suicide education pamphlet was given to the patient to share with family/significant other.  Date and time of first attempt: 11/02/17 /9:37am   Nancy Decker E Tandra Rosado 11/02/2017, 9:38 AM

## 2017-11-02 NOTE — Progress Notes (Signed)
Patient ID: Nancy Decker, female   DOB: 1971/05/06, 47 y.o.   MRN: [id number]  Pt currently presents with a flat affect and depressed behavior. Pt reports to Probation officer that their goal is to "stop taking the Clonidine tomorrow." Pt states "I feel so much better, I have been talking to all the others." Pt reports good sleep with current medication regimen. Presents with signs and symptoms of withdrawal including headache, fatigue, flu-like symptoms, tremors, nausea/vomiting and diarrhea.    Pt provided with medications per providers orders. Pt's labs and vitals were monitored throughout the night. Pt given a 1:1 about emotional and mental status. Pt supported and encouraged to express concerns and questions. Pt educated on medications and substance withdrawal.   Pt's safety ensured with 15 minute and environmental checks. Pt currently denies SI/HI and A/V hallucinations. Pt verbally agrees to seek staff if SI/HI or A/VH occurs and to consult with staff before acting on any harmful thoughts. Will continue POC.

## 2017-11-02 NOTE — Progress Notes (Addendum)
[facility] MD Progress Note  11/02/2017 2:08 PM Nancy Decker  MRN:  [id number]   Subjective:  Patient states that she is doing a little better today, but still feels agitated and feels that she is having withdrawals. She is continuing to complain about her pain. She denies any SI/HI/AVH and contracts for safety. She still reports depression and anxiety as well.     Objective: Patient's chart and findings reviewed and discussed with treatment team. Patient has been continuing to attend group and has been interacting appropriately. Will increase the Cymbalta to 60 mg Daily for depression and anxiety. Will start Neurontin 100 mg TID for alcohol withdrawal symptoms and agitation.   Principal Problem: Severe recurrent major depression without psychotic features ([place]) Diagnosis:   Patient Active Problem List   Diagnosis Date Noted  . Opiate dependence ([place]) [F11.20] 11/01/2017  . Major depressive disorder, recurrent episode, severe ([place]) [F33.2] 10/30/2017  . Severe recurrent major depression without psychotic features ([place]) [F33.2] 10/30/2017   Total Time spent with patient: 30 minutes  Past Psychiatric History: See H&P  Past Medical History:  Past Medical History:  Diagnosis Date  . Back pain   . COPD (chronic obstructive pulmonary disease) ([place])   . Esophageal reflux   . Insomnia   . Kidney injury with open wound   . Mood swings     Past Surgical History:  Procedure Laterality Date  . BACK SURGERY    . bladder tacting    . COLOSTOMY    . LEG SURGERY    . TOTAL ABDOMINAL HYSTERECTOMY    . WISDOM TOOTH EXTRACTION     Family History:  Family History  Problem Relation Age of Onset  . Hypertension Father   . Diabetes Father   . Skin cancer Mother   . Ovarian cancer Mother   . Coronary artery disease Mother   . Throat cancer Mother   . Stroke Sister 57  . Heart murmur Sister 33   Family Psychiatric  History: See H&P Social History:  Social History   Substance and Sexual  Activity  Alcohol Use Never  . Frequency: Never     Social History   Substance and Sexual Activity  Drug Use Yes  . Types: Oxycodone, Marijuana, "Crack" cocaine    Social History   Socioeconomic History  . Marital status: Married    Spouse name: Not on file  . Number of children: Not on file  . Years of education: Not on file  . Highest education level: Not on file  Occupational History  . Not on file  Social Needs  . Financial resource strain: Not on file  . Food insecurity:    Worry: Not on file    Inability: Not on file  . Transportation needs:    Medical: Not on file    Non-medical: Not on file  Tobacco Use  . Smoking status: Current Every Day Smoker    Packs/day: 0.50    Types: Cigarettes  . Smokeless tobacco: Never Used  Substance and Sexual Activity  . Alcohol use: Never    Frequency: Never  . Drug use: Yes    Types: Oxycodone, Marijuana, "Crack" cocaine  . Sexual activity: Yes    Birth control/protection: Surgical  Lifestyle  . Physical activity:    Days per week: Not on file    Minutes per session: Not on file  . Stress: Not on file  Relationships  . Social connections:    Talks on phone: Not on file  Gets together: Not on file    Attends religious service: Not on file    Active member of club or organization: Not on file    Attends meetings of clubs or organizations: Not on file    Relationship status: Not on file  Other Topics Concern  . Not on file  Social History Narrative  . Not on file   Additional Social History:                         Sleep: Fair  Appetite:  Fair  Current Medications: Current Facility-Administered Medications  Medication Dose Route Frequency Provider Last Rate Last Dose  . acetaminophen (TYLENOL) tablet 650 mg  650 mg Oral Q6H PRN Lindon Romp A, NP   650 mg at 11/01/17 1546  . alum & mag hydroxide-simeth (MAALOX/MYLANTA) 200-200-20 MG/5ML suspension 30 mL  30 mL Oral Q4H PRN Lindon Romp A, NP      .  cloNIDine (CATAPRES) tablet 0.1 mg  0.1 mg Oral BH-qamhs Lindon Romp A, NP   0.1 mg at 11/02/17 9323   Followed by  . [START ON 11/04/2017] cloNIDine (CATAPRES) tablet 0.1 mg  0.1 mg Oral QAC breakfast Lindon Romp A, NP      . dicyclomine (BENTYL) tablet 20 mg  20 mg Oral Q6H PRN Lindon Romp A, NP   20 mg at 11/01/17 0908  . [START ON 11/03/2017] DULoxetine (CYMBALTA) DR capsule 60 mg  60 mg Oral Daily Money, Travis B, FNP      . gabapentin (NEURONTIN) capsule 100 mg  100 mg Oral TID Money, Darnelle Maffucci B, FNP   100 mg at 11/02/17 1145  . hydrOXYzine (ATARAX/VISTARIL) tablet 25 mg  25 mg Oral Q6H PRN Lindon Romp A, NP   25 mg at 11/01/17 0908  . lidocaine (LIDODERM) 5 % 1 patch  1 patch Transdermal Daily Money, Lowry Ram, FNP   1 patch at 11/02/17 1150  . loperamide (IMODIUM) capsule 2-4 mg  2-4 mg Oral PRN Lindon Romp A, NP      . magnesium hydroxide (MILK OF MAGNESIA) suspension 30 mL  30 mL Oral Daily PRN Lindon Romp A, NP   30 mL at 11/02/17 0826  . methocarbamol (ROBAXIN) tablet 500 mg  500 mg Oral Q8H PRN Lindon Romp A, NP   500 mg at 11/02/17 0826  . mirtazapine (REMERON) tablet 15 mg  15 mg Oral QHS Money, Travis B, FNP   15 mg at 11/01/17 2214  . naproxen (NAPROSYN) tablet 500 mg  500 mg Oral BID PRN Lindon Romp A, NP   500 mg at 11/02/17 0825  . nicotine (NICODERM CQ - dosed in mg/24 hours) patch 21 mg  21 mg Transdermal Daily Pennelope Bracken, MD   21 mg at 11/02/17 0827  . ondansetron (ZOFRAN) tablet 8 mg  8 mg Oral Q8H PRN Sharma Covert, MD   8 mg at 10/31/17 2133  . QUEtiapine (SEROQUEL) tablet 300 mg  300 mg Oral QHS Lindon Romp A, NP   300 mg at 11/01/17 2214    Lab Results:  No results found for this or any previous visit (from the past 54 hour(s)).  Blood Alcohol level:  Lab Results  Component Value Date   ETH <10 10/30/2017   ETH  02/13/2008    <5        LOWEST DETECTABLE LIMIT FOR SERUM ALCOHOL IS 11 mg/dL FOR MEDICAL PURPOSES ONLY    Metabolic Disorder  Labs: No results found for: HGBA1C, MPG No results found for: PROLACTIN No results found for: CHOL, TRIG, HDL, CHOLHDL, VLDL, LDLCALC  Physical Findings: AIMS: Facial and Oral Movements Muscles of Facial Expression: None, normal Lips and Perioral Area: None, normal Jaw: None, normal Tongue: None, normal,Extremity Movements Upper (arms, wrists, hands, fingers): None, normal Lower (legs, knees, ankles, toes): None, normal, Trunk Movements Neck, shoulders, hips: None, normal, Overall Severity Severity of abnormal movements (highest score from questions above): None, normal Incapacitation due to abnormal movements: None, normal Patient's awareness of abnormal movements (rate only patient's report): No Awareness, Dental Status Current problems with teeth and/or dentures?: No Does patient usually wear dentures?: No  CIWA:    COWS:  COWS Total Score: 3  Musculoskeletal: Strength & Muscle Tone: within normal limits Gait & Station: normal Patient leans: N/A  Psychiatric Specialty Exam: Physical Exam  Nursing note and vitals reviewed. Constitutional: She is oriented to person, place, and time. She appears well-developed and well-nourished.  Cardiovascular: Normal rate.  Respiratory: Effort normal.  Musculoskeletal: Normal range of motion.  Neurological: She is alert and oriented to person, place, and time.  Skin: Skin is warm.    Review of Systems  Constitutional: Negative.   HENT: Negative.   Eyes: Negative.   Respiratory: Negative.   Cardiovascular: Negative.   Gastrointestinal: Negative.   Genitourinary: Negative.   Musculoskeletal: Negative.   Skin: Negative.   Neurological: Negative.   Endo/Heme/Allergies: Negative.   Psychiatric/Behavioral: Positive for depression and substance abuse. Negative for hallucinations and suicidal ideas. The patient is nervous/anxious.     Blood pressure 117/75, pulse 75, temperature 98.3 F (36.8 C), temperature source Oral, resp. rate 18,  height 4\' 8"  (1.422 m), weight 64.9 kg (143 lb).Body mass index is 32.06 kg/m.  General Appearance: Disheveled  Eye Contact:  Good  Speech:  Clear and Coherent and Normal Rate  Volume:  Decreased  Mood:  Depressed and Hopeless  Affect:  Depressed and Tearful  Thought Process:  Goal Directed and Descriptions of Associations: Intact  Orientation:  Full (Time, Place, and Person)  Thought Content:  WDL  Suicidal Thoughts:  No  Homicidal Thoughts:  No  Memory:  Immediate;   Good Recent;   Good Remote;   Good  Judgement:  Fair  Insight:  Fair  Psychomotor Activity:  Normal  Concentration:  Concentration: Good and Attention Span: Good  Recall:  Good  Fund of Knowledge:  Good  Language:  Good  Akathisia:  No  Handed:  Right  AIMS (if indicated):     Assets:  Communication Skills Desire for Improvement Financial Resources/Insurance Housing Social Support  ADL's:  Intact  Cognition:  WNL  Sleep:  Number of Hours: 6.5   Problems Addressed: MDD severe Opiate dependence  Treatment Plan Summary: Daily contact with patient to assess and evaluate symptoms and progress in treatment, Medication management and Plan is to:  -Increase Cymbalta 60 mg PO Daily for mood control and chronic pain -Continue Remeron 15 mg PO QHS for mood stability -Continue Clonidine Detox Protocol -Start Neurontin 100 mg PO TID for alcohol withdrawal  -Continue Lidoderm Patch fo rback pain -Continue Vistaril 25 mg Q6H PRN for anxiety -Continue Seroquel 300 mg PO QHS for mood stability -Encourage group therapy participation  Lewis Shock, FNP 11/02/2017, 2:08 PM   Agree with NP Progress Note

## 2017-11-03 MED ORDER — HYDROXYZINE HCL 25 MG PO TABS: 25.0000 mg | ORAL_TABLET | Freq: Four times a day (QID) | ORAL | 0 refills | Status: DC | PRN

## 2017-11-03 MED ORDER — QUETIAPINE FUMARATE 300 MG PO TABS: 300.0000 mg | ORAL_TABLET | Freq: Every day | ORAL | 0 refills | Status: DC

## 2017-11-03 MED ORDER — DULOXETINE HCL 60 MG PO CPEP: 60.0000 mg | ORAL_CAPSULE | Freq: Every day | ORAL | 0 refills | Status: DC

## 2017-11-03 MED ORDER — MIRTAZAPINE 15 MG PO TABS: 15.0000 mg | ORAL_TABLET | Freq: Every day | ORAL | 0 refills | Status: DC

## 2017-11-03 MED ORDER — GABAPENTIN 100 MG PO CAPS: 100.0000 mg | ORAL_CAPSULE | Freq: Three times a day (TID) | ORAL | 0 refills | Status: DC

## 2017-11-03 NOTE — Progress Notes (Signed)
Recreation Therapy Notes  Date: 4.3.19 Time: 9:30 a.m. Location: 300 [address] Dayroom   Group Topic: Stress Management   Goal Area(s) Addresses:  Goal 1.1: To reduce stress  -Patient will report feeling a reduction in stress level  -Patient will identify the importance of stress management  -Patient will participate during stress management group treatment     Behavioral Response: Engaged   Intervention: Stress Management   Activity: Meditation- Patients were in a peaceful environment with soft lighting enhancing patients mood. Patients listened to a body scan meditation to help decrease tension and stress levels   Education: Stress Management, Discharge Planning.    Education Outcome: Acknowledges edcuation/In group clarification offered/Needs additional education   Clinical Observations/Feedback:: Patient attended and participated appropriately during stress management group treatment.    Ranell Patrick, Recreation Therapy Intern   Ranell Patrick 11/03/2017 8:14 AM

## 2017-11-03 NOTE — BHH Suicide Risk Assessment (Addendum)
[facility] Discharge Suicide Risk Assessment   Principal Problem: Severe recurrent major depression without psychotic features [facility]) Discharge Diagnoses:  Patient Active Problem List   Diagnosis Date Noted  . Opiate dependence ([place]) [F11.20] 11/01/2017  . Major depressive disorder, recurrent episode, severe ([place]) [F33.2] 10/30/2017  . Severe recurrent major depression without psychotic features ([place]) [F33.2] 10/30/2017    Total Time spent with patient: 30 minutes  Musculoskeletal: Strength & Muscle Tone: within normal limits Gait & Station: normal Patient leans: N/A  Psychiatric Specialty Exam: ROS denies chest pain , no shortness of breath, no vomiting, no fever or chills  Blood pressure 136/80, pulse 86, temperature 98.3 F (36.8 C), temperature source Oral, resp. rate 18, height 4\' 8"  (1.422 m), weight 64.9 kg (143 lb).Body mass index is 32.06 kg/m.  General Appearance: improved grooming   Eye Contact::  Good  Speech:  Normal Rate409  Volume:  Normal  Mood:  Improved mood, currently denies depression and describes mood as 8/10  Affect:  Appropriate  Thought Process:  Linear and Descriptions of Associations: Intact  Orientation:  Other:  Fully alert and attentive  Thought Content:  No hallucinations, no delusions, not internally preoccupied  Suicidal Thoughts:  No denies any suicidal ideations  Homicidal Thoughts:  No  Memory:  Recent and remote grossly intact  Judgement:  Other:  Improved  Insight:  Improved  Psychomotor Activity:  Normal  Concentration:  Good  Recall:  Good  Fund of Knowledge:Good  Language: Good  Akathisia:  Negative  Handed:  Right  AIMS (if indicated):     Assets:  Communication Skills Desire for Improvement Resilience  Sleep:  Number of Hours: 6  Cognition: WNL  ADL's:  Intact   Mental Status Per Nursing Assessment::   On Admission:  Suicidal ideation indicated by patient  Demographic Factors:  47 year old married female, lives with husband,  employed   Loss Factors: Substance abuse, her son has history of substance abuse and  is currently incarcerated  Historical Factors: History of depression, history of substance abuse  Risk Reduction Factors:   Living with another person, especially a relative and Positive coping skills or problem solving skills  Continued Clinical Symptoms:  At present patient reports feeling better, currently minimizes depression states her mood is "good".  Identifies mood as 8/10.  Affect is reactive.  Denies suicidal or homicidal ideations.  No hallucinations.  No delusions are expressed.  Denies lingering or residual WDL symptoms and does not appear to be in any acute distress- vitals are stable No disruptive behaviors on unit, pleasant on approach. Currently denies medication side effects.  States that she has been on Seroquel for years for management of mood and insomnia, denies having side effects.  Side effect profile reviewed.  Cognitive Features That Contribute To Risk:  No gross cognitive deficits noted upon discharge. Is alert , attentive, and oriented x 3     Suicide Risk:  Mild:  Suicidal ideation of limited frequency, intensity, duration, and specificity.  There are no identifiable plans, no associated intent, mild dysphoria and related symptoms, good self-control (both objective and subjective assessment), few other risk factors, and identifiable protective factors, including available and accessible social support.  [place] Follow up on 11/17/2017.   Why:  Medication management on Wednesday, 4/17 at 1:00PM with Dr. Darleene Cleaver. You have been placed on cancellation list if an appt becomes available sooner.  Therapy appt on 4/25 at 10:00AM with [facility]. Please bring photo  ID/insurance card to first appt.  Contact information: [address] [place] [address] [phone]           Plan Of Care/Follow-up recommendations:   Activity:  as tolerated  Diet:  regular Tests:  NA Other:  see below  Patient is expressing readiness for discharge, no current grounds for involuntary commitment. She is leading unit in good spirits. Plans to return home. Plans to follow-up as above.   Jenne Campus, MD 11/03/2017, 1:03 PM

## 2017-11-03 NOTE — Progress Notes (Signed)
  [facility] Adult Case Management Discharge Plan :  Will you be returning to the same living situation after discharge:  Yes,  home At discharge, do you have transportation home?: Yes,  husband/family member Do you have the ability to pay for your medications: Yes,  [facility]  Release of information consent forms completed and submitted to medical records by CSW.  Patient to Follow up at: [place] Follow up on 11/17/2017.   Why:  Medication management on Wednesday, 4/17 at 1:00PM with Dr. Darleene Cleaver. You have been placed on cancellation list if an appt becomes available sooner.  Therapy appt on 4/25 at 10:00AM with [facility] Please bring photo ID/insurance card to first appt.  Contact information: [place] [address] [phone]           Next level of care provider has access to [place] and Suicide Prevention discussed: Yes,  SPE completed with pt; contact attempts made with pt's husband. Pt provided with SPI pamphlet and Mobile Crisis information.   Have you used any form of tobacco in the last 30 days? (Cigarettes, Smokeless Tobacco, Cigars, and/or Pipes): Yes  Has patient been referred to the Quitline?: Patient refused referral  Patient has been referred for addiction treatment: Yes  [employer], LCSW 11/03/2017, 10:04 AM

## 2017-11-03 NOTE — [facility] Suicide Risk Assessment (Signed)
[facility] INPATIENT:  Family/Significant Other Suicide Prevention Education  Suicide Prevention Education:  Contact Attempts: Dorthula Nettles, husband [phone]) has been identified by the patient as the family member/significant other with whom the patient will be residing, and identified as the person(s) who will aid the patient in the event of a mental health crisis.  With written consent from the patient, two attempts were made to provide suicide prevention education, prior to and/or following the patient's discharge.  We were unsuccessful in providing suicide prevention education.  A suicide education pamphlet was given to the patient to share with family/significant other.  Date and time of first attempt: 1st attempt by Radonna Ricker, LCSWA on 11/02/17 (message left requesting call back).  Date and time of second attempt: 11/03/17 at 10:03AM  [employer] LCSW 11/03/2017, 10:03 AM

## 2017-11-03 NOTE — Progress Notes (Signed)
Nursing Progress Note [id number]  Data: Patient presents with anxious mood but is cooperative with Probation officer. Patient took morning medications without incident. Patient states goal for today is to "talk to the doctor about discharge". Patient denies SI/HI/AVH. Patient contracts for safety on the unit at this time. Patient endorses chronic back pain relieved with her lidocine patch. Patient completed self-inventory sheet and rates depression, hopelessness, and anxiety 1,0,1 respectively.   Action: Patient educated about and provided medication per provider's orders. Patient safety maintained with q15 min safety checks. Low fall risk precautions in place. Emotional support given. 1:1 interaction and active listening provided. Patient encouraged to attend meals and groups. Labs, vital signs and patient behavior monitored throughout shift. Patient encouraged to work on treatment plan and goals.  Response: Patient remains safe on the unit at this time. Will continue to support and monitor.

## 2017-11-03 NOTE — Progress Notes (Signed)
Discharge Note  D) Patient discharged to lobby on 11/03/2017 1:41 PM. Patient states readiness for discharge. Patient denies SI/HI, AVH and is not delusional or psychotic. Patient in no acute distress.  A) Written and verbal discharge instructions given to the patient. Patient accepting and verbalized understanding. Patient agrees to the discharge plan. Opportunity for questions and concerns presented to patient. Patient denied any further questions or concerns. All belongings returned to patient. Patient signed for return of belongings and discharge paperwork. Patient provided follow-up appointment, prescriptions, and discharge paperwork.  R) Patient safely escorted to the lobby. Patient discharged from [facility] with prescriptions, personal belongings, follow-up appointment in place and discharge paperwork.

## 2017-11-03 NOTE — Discharge Summary (Addendum)
Physician Discharge Summary Note  Patient:  Nancy Decker is an 47 y.o., female MRN:  [id number] DOB:  [birth year] Patient phone:  [phone] (home)  Patient address:   [place] [address],  Total Time spent with patient: 20 minutes  Date of Admission:  10/30/2017 Date of Discharge: 11/03/17  Reason for Admission: Per assessment note-47 y.o.marriedfemalewho presents unaccompanied [facility] reporting symptoms of depression, suicidal ideation, auditory hallucinations and withdrawal from abusing substances.She says "I think I am having a nervous breakdown" and says she can no longer work or do her daily activities. She says she started hearing voices of people talking outside her house when there is no one there and these hallucinations have increased in frequency and intensity.Pt acknowledges symptoms including crying spells, social withdrawal, loss of interest in usual pleasures, fatigue, decreased concentration, decreased sleep and feelings of guilt and hopelessness.She says she is sleeping two hours per night and "my mind just races." She reports suicidal ideation with no intent but says she is desperate and has attempted suicide twice in the past, once by overdose and once by threatening to shoot herself. She denies visual hallucinations. She denies current homicidal ideation or history of violence.     Principal Problem: Severe recurrent major depression without psychotic features [facility]) Discharge Diagnoses: Patient Active Problem List   Diagnosis Date Noted  . Opiate dependence ([place]) [F11.20] 11/01/2017  . Major depressive disorder, recurrent episode, severe ([place]) [F33.2] 10/30/2017  . Severe recurrent major depression without psychotic features ([place]) [F33.2] 10/30/2017    Past Psychiatric History:   Past Medical History:  Past Medical History:  Diagnosis Date  . Back pain   . COPD (chronic obstructive pulmonary disease) ([place])   . Esophageal reflux    . Insomnia   . Kidney injury with open wound   . Mood swings     Past Surgical History:  Procedure Laterality Date  . BACK SURGERY    . bladder tacting    . COLOSTOMY    . LEG SURGERY    . TOTAL ABDOMINAL HYSTERECTOMY    . WISDOM TOOTH EXTRACTION     Family History:  Family History  Problem Relation Age of Onset  . Hypertension Father   . Diabetes Father   . Skin cancer Mother   . Ovarian cancer Mother   . Coronary artery disease Mother   . Throat cancer Mother   . Stroke Sister 77  . Heart murmur Sister 73   Family Psychiatric  History:  Social History:  Social History   Substance and Sexual Activity  Alcohol Use Never  . Frequency: Never     Social History   Substance and Sexual Activity  Drug Use Yes  . Types: Oxycodone, Marijuana, "Crack" cocaine    Social History   Socioeconomic History  . Marital status: Married    Spouse name: Not on file  . Number of children: Not on file  . Years of education: Not on file  . Highest education level: Not on file  Occupational History  . Not on file  Social Needs  . Financial resource strain: Not on file  . Food insecurity:    Worry: Not on file    Inability: Not on file  . Transportation needs:    Medical: Not on file    Non-medical: Not on file  Tobacco Use  . Smoking status: Current Every Day Smoker    Packs/day: 0.50    Types: Cigarettes  . Smokeless tobacco:  Never Used  Substance and Sexual Activity  . Alcohol use: Never    Frequency: Never  . Drug use: Yes    Types: Oxycodone, Marijuana, "Crack" cocaine  . Sexual activity: Yes    Birth control/protection: Surgical  Lifestyle  . Physical activity:    Days per week: Not on file    Minutes per session: Not on file  . Stress: Not on file  Relationships  . Social connections:    Talks on phone: Not on file    Gets together: Not on file    Attends religious service: Not on file    Active member of club or organization: Not on file    Attends  meetings of clubs or organizations: Not on file    Relationship status: Not on file  Other Topics Concern  . Not on file  Social History Narrative  . Not on file    Hospital Course:  Nancy Decker was admitted for Severe recurrent major depression without psychotic features (HCC)and crisis management.  Pt was treated discharged with the medications listed below under Medication List.  Medical problems were identified and treated as needed.  Home medications were restarted as appropriate.  Improvement was monitored by observation and Nancy Decker daily report of symptom reduction.  Emotional and mental status was monitored by daily self-inventory reports completed by Nancy Decker.         Nancy Decker was evaluated by the treatment team for stability and plans for continued recovery upon discharge. Nancy Decker 's motivation was an integral factor for scheduling further treatment. Employment, transportation, bed availability, health status, family support, and any pending legal issues were also considered during hospital stay. Pt was offered further treatment options upon discharge including but not limited to Residential, Intensive Outpatient, and Outpatient treatment.  Nancy Decker will follow up with the services as listed below under Follow Up Information.     Upon completion of this admission the patient was both mentally and medically stable for discharge denying suicidal/homicidal ideation, auditory/visual/tactile hallucinations, delusional thoughts and paranoia.    Nancy Decker responded well to treatment with Cymbalta, Neurontin and Remeron without adverse effects. Initially, pt did complain of headache while on a detox protocol (clondine) with these medications, but this resolved. Pt demonstrated improvement without reported or observed adverse effects to the point of stability appropriate for outpatient management. Pertinent  labs include: UDS + opiates and tch , for which outpatient follow-up is necessary for lab recheck as mentioned below. Reviewed CBC, CMP, BAL, and UDS; all unremarkable aside from noted exceptions.   Physical Findings: AIMS: Facial and Oral Movements Muscles of Facial Expression: None, normal Lips and Perioral Area: None, normal Jaw: None, normal Tongue: None, normal,Extremity Movements Upper (arms, wrists, hands, fingers): None, normal Lower (legs, knees, ankles, toes): None, normal, Trunk Movements Neck, shoulders, hips: None, normal, Overall Severity Severity of abnormal movements (highest score from questions above): None, normal Incapacitation due to abnormal movements: None, normal PatientDecker awareness of abnormal movements (rate only patientDecker report): No Awareness, Dental Status Current problems with teeth and/or dentures?: No Does patient usually wear dentures?: No  CIWA:    COWS:  COWS Total Score: 4  Musculoskeletal: Strength & Muscle Tone: within normal limits Gait & Station: normal Patient leans: N/A  Psychiatric Specialty Exam: See SRA by MD  Physical Exam  Vitals reviewed. Constitutional: She appears well-developed.  Cardiovascular: Normal rate.  Neurological: She is alert.  Psychiatric: She has a normal mood and affect. Her behavior is normal.    Review of Systems  Psychiatric/Behavioral: Negative for depression (improving ) and suicidal ideas. The patient is nervous/anxious.     Blood pressure 136/80, pulse 92, temperature 98.3 F (36.8 C), temperature source Oral, resp. rate 18, height 4\' 8"  (1.422 m), weight 64.9 kg (143 lb).Body mass index is 32.06 kg/m.   Have you used any form of tobacco in the last 30 days? (Cigarettes, Smokeless Tobacco, Cigars, and/or Pipes): Yes  Has this patient used any form of tobacco in the last 30 days? (Cigarettes, Smokeless Tobacco, Cigars, and/or Pipes) Yes, No  Blood Alcohol level:  Lab Results  Component Value Date   ETH  <10 10/30/2017   [facility]  02/13/2008    <5        LOWEST DETECTABLE LIMIT FOR SERUM ALCOHOL IS 11 mg/dL FOR MEDICAL PURPOSES ONLY    Metabolic Disorder Labs:  No results found for: HGBA1C, MPG No results found for: PROLACTIN No results found for: CHOL, TRIG, HDL, CHOLHDL, VLDL, LDLCALC  See Psychiatric Specialty Exam and Suicide Risk Assessment completed by Attending Physician prior to discharge.  Discharge destination:  Home  Is patient on multiple antipsychotic therapies at discharge:  No   Has Patient had three or more failed trials of antipsychotic monotherapy by history:  No  Recommended Plan for Multiple Antipsychotic Therapies: NA   Allergies as of 11/03/2017   No Known Allergies     Medication List    TAKE these medications     Indication  DULoxetine 60 MG capsule Commonly known as:  CYMBALTA Take 1 capsule (60 mg total) by mouth daily. For mood control Start taking on:  11/04/2017  Indication:  mood stability   gabapentin 100 MG capsule Commonly known as:  NEURONTIN Take 1 capsule (100 mg total) by mouth 3 (three) times daily. For agitation and pain  Indication:  Neuropathic Pain, anxiety   hydrOXYzine 25 MG tablet Commonly known as:  ATARAX/VISTARIL Take 1 tablet (25 mg total) by mouth every 6 (six) hours as needed for anxiety.  Indication:  Feeling Anxious   mirtazapine 15 MG tablet Commonly known as:  REMERON Take 1 tablet (15 mg total) by mouth at bedtime. For mood control  Indication:  mood stability   QUEtiapine 300 MG tablet Commonly known as:  SEROQUEL Take 1 tablet (300 mg total) by mouth at bedtime. For mood control What changed:  additional instructions  Indication:  mood stability      Follow-up [place] Follow up on 11/17/2017.   Why:  Medication management on Wednesday, 4/17 at 1:00PM with Dr. Darleene Cleaver. You have been placed on cancellation list if an appt becomes available sooner.  Therapy appt on 4/25 at  10:00AM with [facility]. Please bring photo ID/insurance card to first appt.  Contact information: [address] [place] [address] [phone]           Follow-up recommendations:  Activity:  as tolerated Diet:  heart healthy  Comments:  Take all medications as prescribed. Keep all follow-up appointments as scheduled.  Do not consume alcohol or use illegal drugs while on prescription medications. Report any adverse effects from your medications to your primary care provider promptly.  In the event of recurrent symptoms or worsening symptoms, call 911, a crisis hotline, or go to the nearest emergency department for evaluation.   Signed: Derrill Center, NP 11/03/2017, 9:44 AM  Patient seen, Suicide Assessment Completed.  Disposition Plan Reviewed .

## 2018-03-12 ENCOUNTER — Emergency Department (HOSPITAL_COMMUNITY): Payer: Self-pay

## 2018-03-12 ENCOUNTER — Encounter (HOSPITAL_COMMUNITY): Payer: Self-pay

## 2018-03-12 ENCOUNTER — Emergency Department (HOSPITAL_COMMUNITY)
Admission: EM | Admit: 2018-03-12 | Discharge: 2018-03-12 | Disposition: A | Discharge status: Home / Self Care | Payer: Self-pay | Attending: Emergency Medicine | Admitting: Emergency Medicine

## 2018-03-12 DIAGNOSIS — Z79899 Other long term (current) drug therapy: Secondary | ICD-10-CM | POA: Insufficient documentation

## 2018-03-12 DIAGNOSIS — J449 Chronic obstructive pulmonary disease, unspecified: Secondary | ICD-10-CM | POA: Insufficient documentation

## 2018-03-12 DIAGNOSIS — M546 Pain in thoracic spine: Secondary | ICD-10-CM | POA: Insufficient documentation

## 2018-03-12 DIAGNOSIS — F17210 Nicotine dependence, cigarettes, uncomplicated: Secondary | ICD-10-CM | POA: Insufficient documentation

## 2018-03-12 DIAGNOSIS — J40 Bronchitis, not specified as acute or chronic: Secondary | ICD-10-CM | POA: Insufficient documentation

## 2018-03-12 LAB — BASIC METABOLIC PANEL
Anion gap: 11 (ref 5–15)
BUN: 7 mg/dL (ref 6–20)
CALCIUM: 9.5 mg/dL (ref 8.9–10.3)
CO2: 23 mmol/L (ref 22–32)
Chloride: 107 mmol/L (ref 98–111)
Creatinine, Ser: 0.61 mg/dL (ref 0.44–1.00)
GFR calc Af Amer: >60 mL/min (ref >60)
GLUCOSE: 145 mg/dL — AB (ref 70–99)
Potassium: 3.6 mmol/L (ref 3.5–5.1)
Sodium: 141 mmol/L (ref 135–145)

## 2018-03-12 LAB — CBC
HCT: 44.3 % (ref 36.0–46.0)
Hemoglobin: 14.5 g/dL (ref 12.0–15.0)
MCH: 29.2 pg (ref 26.0–34.0)
MCHC: 32.7 g/dL (ref 30.0–36.0)
MCV: 89.3 fL (ref 78.0–100.0)
Platelets: 264 10*3/uL (ref 150–400)
RBC: 4.96 MIL/uL (ref 3.87–5.11)
RDW: 12.3 % (ref 11.5–15.5)
WBC: 6.8 10*3/uL (ref 4.0–10.5)

## 2018-03-12 LAB — D-DIMER, QUANTITATIVE (NOT AT ARMC)

## 2018-03-12 MED ORDER — ALBUTEROL SULFATE HFA 108 (90 BASE) MCG/ACT IN AERS: 2.0000 | INHALATION_SPRAY | RESPIRATORY_TRACT | 1 refills | Status: DC | PRN

## 2018-03-12 MED ORDER — ALBUTEROL SULFATE (2.5 MG/3ML) 0.083% IN NEBU
5.0000 mg | INHALATION_SOLUTION | Freq: Once | RESPIRATORY_TRACT | Status: AC
  Administered 2018-03-12: 5 mg via RESPIRATORY_TRACT
  Filled 2018-03-12: qty 6

## 2018-03-12 MED ORDER — ALBUTEROL SULFATE (2.5 MG/3ML) 0.083% IN NEBU
5.0000 mg | INHALATION_SOLUTION | Freq: Once | RESPIRATORY_TRACT | Status: DC
  Filled 2018-03-12: qty 6

## 2018-03-12 MED ORDER — DOXYCYCLINE HYCLATE 100 MG PO CAPS
100.0000 mg | ORAL_CAPSULE | Freq: Two times a day (BID) | ORAL | 0 refills | Status: DC
Start: 2018-03-12 — End: 2018-06-04

## 2018-03-12 MED ORDER — IBUPROFEN 400 MG PO TABS
400.0000 mg | ORAL_TABLET | Freq: Once | ORAL | Status: AC
  Administered 2018-03-12: 400 mg via ORAL
  Filled 2018-03-12: qty 1

## 2018-03-12 MED ORDER — ACETAMINOPHEN 500 MG PO TABS
1000.0000 mg | ORAL_TABLET | Freq: Once | ORAL | Status: AC
  Administered 2018-03-12: 1000 mg via ORAL
  Filled 2018-03-12: qty 2

## 2018-03-12 MED ORDER — IPRATROPIUM BROMIDE 0.02 % IN SOLN
0.5000 mg | Freq: Once | RESPIRATORY_TRACT | Status: DC
  Filled 2018-03-12: qty 2.5

## 2018-03-12 MED ORDER — DOXYCYCLINE HYCLATE 100 MG PO CAPS: 100.0000 mg | ORAL_CAPSULE | Freq: Two times a day (BID) | ORAL | 0 refills | Status: DC

## 2018-03-12 NOTE — ED Triage Notes (Signed)
Onset 1 week cough, shortness of breath, and dysuria.  Using inhalers with no relief.

## 2018-03-12 NOTE — Discharge Instructions (Addendum)
It was our pleasure to provide your ER care today - we hope that you feel better.  Use albuterol inhaler as need.  Take antibiotic as prescribed.  Avoid any smoking.  Take acetaminophen and/or ibuprofen as need for back pain.   Take robitussin or mucinex as need for cough.   Follow up with primary care doctor in the coming week for recheck.  Return to ER if worse, new symptoms, increased trouble breathing, other concern.

## 2018-03-12 NOTE — ED Notes (Signed)
Patient transported to X-ray 

## 2018-03-12 NOTE — ED Notes (Signed)
Patient Alert and oriented to baseline. Stable and ambulatory to baseline. Patient verbalized understanding of the discharge instructions.  Patient belongings were taken by the patient.   
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
## 2018-06-03 ENCOUNTER — Emergency Department (HOSPITAL_COMMUNITY): Payer: No Typology Code available for payment source

## 2018-06-03 ENCOUNTER — Other Ambulatory Visit: Payer: Self-pay

## 2018-06-03 ENCOUNTER — Inpatient Hospital Stay (HOSPITAL_COMMUNITY)
Admission: EM | Admit: 2018-06-03 | Discharge: 2018-06-07 | DRG: 392 | Disposition: A | Discharge status: Home / Self Care | Payer: No Typology Code available for payment source | Attending: Internal Medicine | Admitting: Internal Medicine

## 2018-06-03 ENCOUNTER — Encounter (HOSPITAL_COMMUNITY): Payer: Self-pay | Admitting: Emergency Medicine

## 2018-06-03 DIAGNOSIS — J449 Chronic obstructive pulmonary disease, unspecified: Secondary | ICD-10-CM | POA: Diagnosis present

## 2018-06-03 DIAGNOSIS — Z765 Malingerer [conscious simulation]: Secondary | ICD-10-CM | POA: Diagnosis not present

## 2018-06-03 DIAGNOSIS — G8929 Other chronic pain: Secondary | ICD-10-CM | POA: Diagnosis present

## 2018-06-03 DIAGNOSIS — K5792 Diverticulitis of intestine, part unspecified, without perforation or abscess without bleeding: Secondary | ICD-10-CM | POA: Diagnosis present

## 2018-06-03 DIAGNOSIS — F17210 Nicotine dependence, cigarettes, uncomplicated: Secondary | ICD-10-CM | POA: Diagnosis present

## 2018-06-03 DIAGNOSIS — Z79899 Other long term (current) drug therapy: Secondary | ICD-10-CM | POA: Diagnosis not present

## 2018-06-03 DIAGNOSIS — F332 Major depressive disorder, recurrent severe without psychotic features: Secondary | ICD-10-CM | POA: Diagnosis present

## 2018-06-03 DIAGNOSIS — F17200 Nicotine dependence, unspecified, uncomplicated: Secondary | ICD-10-CM

## 2018-06-03 DIAGNOSIS — F1210 Cannabis abuse, uncomplicated: Secondary | ICD-10-CM | POA: Diagnosis present

## 2018-06-03 DIAGNOSIS — K5732 Diverticulitis of large intestine without perforation or abscess without bleeding: Secondary | ICD-10-CM | POA: Diagnosis present

## 2018-06-03 DIAGNOSIS — Z23 Encounter for immunization: Secondary | ICD-10-CM

## 2018-06-03 DIAGNOSIS — IMO0001 Reserved for inherently not codable concepts without codable children: Secondary | ICD-10-CM

## 2018-06-03 DIAGNOSIS — M549 Dorsalgia, unspecified: Secondary | ICD-10-CM | POA: Diagnosis present

## 2018-06-03 DIAGNOSIS — F1120 Opioid dependence, uncomplicated: Secondary | ICD-10-CM | POA: Diagnosis present

## 2018-06-03 HISTORY — DX: Sleep apnea, unspecified: G47.30

## 2018-06-03 HISTORY — DX: Other chronic pain: G89.29

## 2018-06-03 HISTORY — DX: Low back pain: M54.5

## 2018-06-03 HISTORY — DX: Fibromyalgia: M79.7

## 2018-06-03 HISTORY — DX: Personal history of other medical treatment: Z92.89

## 2018-06-03 HISTORY — DX: Low back pain, unspecified: M54.50

## 2018-06-03 HISTORY — DX: Rheumatoid arthritis, unspecified: M06.9

## 2018-06-03 LAB — CBC
HCT: 41.1 % (ref 36.0–46.0)
Hemoglobin: 13.6 g/dL (ref 12.0–15.0)
MCH: 29.6 pg (ref 26.0–34.0)
MCHC: 33.1 g/dL (ref 30.0–36.0)
MCV: 89.3 fL (ref 80.0–100.0)
Platelets: 247 10*3/uL (ref 150–400)
RBC: 4.6 MIL/uL (ref 3.87–5.11)
RDW: 12.3 % (ref 11.5–15.5)
WBC: 7.3 10*3/uL (ref 4.0–10.5)
nRBC: 0 % (ref 0.0–0.2)

## 2018-06-03 LAB — CBC WITH DIFFERENTIAL/PLATELET
Abs Immature Granulocytes: 0.03 10*3/uL (ref 0.00–0.07)
BASOS PCT: 0 %
Basophils Absolute: 0 10*3/uL (ref 0.0–0.1)
EOS ABS: 0.1 10*3/uL (ref 0.0–0.5)
Eosinophils Relative: 1 %
HCT: 44.6 % (ref 36.0–46.0)
Hemoglobin: 13.9 g/dL (ref 12.0–15.0)
IMMATURE GRANULOCYTES: 0 %
LYMPHS ABS: 1.7 10*3/uL (ref 0.7–4.0)
LYMPHS PCT: 19 %
MCH: 28.8 pg (ref 26.0–34.0)
MCHC: 31.2 g/dL (ref 30.0–36.0)
MCV: 92.3 fL (ref 80.0–100.0)
MONOS PCT: 5 %
Monocytes Absolute: 0.5 10*3/uL (ref 0.1–1.0)
NEUTROS PCT: 75 %
NRBC: 0 % (ref 0.0–0.2)
Neutro Abs: 6.5 10*3/uL (ref 1.7–7.7)
PLATELETS: 346 10*3/uL (ref 150–400)
RBC: 4.83 MIL/uL (ref 3.87–5.11)
RDW: 12.3 % (ref 11.5–15.5)
WBC: 8.7 10*3/uL (ref 4.0–10.5)

## 2018-06-03 LAB — COMPREHENSIVE METABOLIC PANEL
ALT: 21 U/L (ref 0–44)
ANION GAP: 5 (ref 5–15)
AST: 17 U/L (ref 15–41)
Albumin: 3.8 g/dL (ref 3.5–5.0)
Alkaline Phosphatase: 68 U/L (ref 38–126)
BUN: 10 mg/dL (ref 6–20)
CHLORIDE: 108 mmol/L (ref 98–111)
CO2: 27 mmol/L (ref 22–32)
Calcium: 9.2 mg/dL (ref 8.9–10.3)
Creatinine, Ser: 0.58 mg/dL (ref 0.44–1.00)
GFR calc Af Amer: >60 mL/min (ref >60)
Glucose, Bld: 134 mg/dL — ABNORMAL HIGH (ref 70–99)
POTASSIUM: 3.5 mmol/L (ref 3.5–5.1)
Sodium: 140 mmol/L (ref 135–145)
Total Bilirubin: 0.5 mg/dL (ref 0.3–1.2)
Total Protein: 6.8 g/dL (ref 6.5–8.1)

## 2018-06-03 LAB — URINALYSIS, ROUTINE W REFLEX MICROSCOPIC
Bilirubin Urine: NEGATIVE
Glucose, UA: NEGATIVE mg/dL
Hgb urine dipstick: NEGATIVE
Ketones, ur: NEGATIVE mg/dL
LEUKOCYTES UA: NEGATIVE
Nitrite: NEGATIVE
Protein, ur: NEGATIVE mg/dL
SPECIFIC GRAVITY, URINE: 1.021 (ref 1.005–1.030)
pH: 6 (ref 5.0–8.0)

## 2018-06-03 LAB — CREATININE, SERUM
Creatinine, Ser: 0.7 mg/dL (ref 0.44–1.00)
GFR calc non Af Amer: >60 mL/min (ref >60)

## 2018-06-03 LAB — I-STAT CG4 LACTIC ACID, ED: Lactic Acid, Venous: 1.15 mmol/L (ref 0.5–1.9)

## 2018-06-03 MED ORDER — ONDANSETRON HCL 4 MG/2ML IJ SOLN
4.0000 mg | Freq: Once | INTRAMUSCULAR | Status: AC
  Administered 2018-06-03: 4 mg via INTRAVENOUS
  Filled 2018-06-03: qty 2

## 2018-06-03 MED ORDER — HEPARIN SODIUM (PORCINE) 5000 UNIT/ML IJ SOLN
5000.0000 [IU] | Freq: Three times a day (TID) | INTRAMUSCULAR | Status: DC
  Administered 2018-06-04 – 2018-06-07 (×9): 5000 [IU] via SUBCUTANEOUS
  Filled 2018-06-03 (×11): qty 1

## 2018-06-03 MED ORDER — BISACODYL 10 MG RE SUPP: 10.0000 mg | Freq: Every day | RECTAL | Status: DC | PRN

## 2018-06-03 MED ORDER — INFLUENZA VAC SPLIT QUAD 0.5 ML IM SUSY
0.5000 mL | PREFILLED_SYRINGE | INTRAMUSCULAR | Status: AC
  Administered 2018-06-04: 0.5 mL via INTRAMUSCULAR
  Filled 2018-06-03: qty 0.5

## 2018-06-03 MED ORDER — IPRATROPIUM-ALBUTEROL 0.5-2.5 (3) MG/3ML IN SOLN: 3.0000 mL | Freq: Four times a day (QID) | RESPIRATORY_TRACT | Status: DC | PRN

## 2018-06-03 MED ORDER — IOHEXOL 300 MG/ML  SOLN
100.0000 mL | Freq: Once | INTRAMUSCULAR | Status: AC | PRN
  Administered 2018-06-03: 100 mL via INTRAVENOUS

## 2018-06-03 MED ORDER — SODIUM CHLORIDE 0.9 % IV SOLN
2.0000 g | Freq: Every day | INTRAVENOUS | Status: DC
  Administered 2018-06-03 – 2018-06-06 (×4): 2 g via INTRAVENOUS
  Filled 2018-06-03 (×5): qty 20

## 2018-06-03 MED ORDER — SODIUM CHLORIDE 0.9 % IV SOLN
3.0000 g | Freq: Three times a day (TID) | INTRAVENOUS | Status: DC
  Administered 2018-06-03: 3 g via INTRAVENOUS
  Filled 2018-06-03 (×2): qty 3

## 2018-06-03 MED ORDER — KCL IN DEXTROSE-NACL 20-5-0.45 MEQ/L-%-% IV SOLN
INTRAVENOUS | Status: DC
  Administered 2018-06-03 – 2018-06-05 (×4): via INTRAVENOUS
  Filled 2018-06-03 (×4): qty 1000

## 2018-06-03 MED ORDER — ACETAMINOPHEN 650 MG RE SUPP: 650.0000 mg | Freq: Four times a day (QID) | RECTAL | Status: DC | PRN

## 2018-06-03 MED ORDER — MORPHINE SULFATE (PF) 4 MG/ML IV SOLN
4.0000 mg | Freq: Once | INTRAVENOUS | Status: AC
  Administered 2018-06-03: 4 mg via INTRAVENOUS
  Filled 2018-06-03: qty 1

## 2018-06-03 MED ORDER — TRAMADOL HCL 50 MG PO TABS
50.0000 mg | ORAL_TABLET | Freq: Four times a day (QID) | ORAL | Status: DC | PRN
  Administered 2018-06-03 – 2018-06-07 (×8): 50 mg via ORAL
  Filled 2018-06-03 (×10): qty 1

## 2018-06-03 MED ORDER — QUETIAPINE FUMARATE 300 MG PO TABS
300.0000 mg | ORAL_TABLET | Freq: Every day | ORAL | Status: DC
  Administered 2018-06-03 – 2018-06-06 (×4): 300 mg via ORAL
  Filled 2018-06-03 (×4): qty 3
  Filled 2018-06-03 (×4): qty 1

## 2018-06-03 MED ORDER — ACETAMINOPHEN 325 MG PO TABS
650.0000 mg | ORAL_TABLET | Freq: Four times a day (QID) | ORAL | Status: DC | PRN
  Administered 2018-06-04 – 2018-06-06 (×2): 650 mg via ORAL
  Filled 2018-06-03 (×2): qty 2

## 2018-06-03 MED ORDER — PIPERACILLIN-TAZOBACTAM 3.375 G IVPB 30 MIN: 3.3750 g | Freq: Once | INTRAVENOUS | Status: DC

## 2018-06-03 MED ORDER — METRONIDAZOLE IN NACL 5-0.79 MG/ML-% IV SOLN
500.0000 mg | Freq: Three times a day (TID) | INTRAVENOUS | Status: DC
  Administered 2018-06-03 – 2018-06-07 (×11): 500 mg via INTRAVENOUS
  Filled 2018-06-03 (×12): qty 100

## 2018-06-03 MED ORDER — PNEUMOCOCCAL VAC POLYVALENT 25 MCG/0.5ML IJ INJ
0.5000 mL | INJECTION | INTRAMUSCULAR | Status: AC
  Administered 2018-06-04: 0.5 mL via INTRAMUSCULAR
  Filled 2018-06-03: qty 0.5

## 2018-06-03 MED ORDER — MIRTAZAPINE 15 MG PO TABS
15.0000 mg | ORAL_TABLET | Freq: Every day | ORAL | Status: DC
  Administered 2018-06-03 – 2018-06-06 (×4): 15 mg via ORAL
  Filled 2018-06-03 (×4): qty 1

## 2018-06-03 MED ORDER — HYDROMORPHONE HCL 1 MG/ML IJ SOLN
1.0000 mg | Freq: Once | INTRAMUSCULAR | Status: AC
  Administered 2018-06-03: 1 mg via INTRAVENOUS
  Filled 2018-06-03: qty 1

## 2018-06-03 MED ORDER — MORPHINE SULFATE (PF) 2 MG/ML IV SOLN
2.0000 mg | INTRAVENOUS | Status: DC | PRN
  Administered 2018-06-03 – 2018-06-04 (×2): 2 mg via INTRAVENOUS
  Filled 2018-06-03 (×2): qty 1
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
## 2018-06-03 NOTE — ED Triage Notes (Signed)
Pt to ER for evaluation of 3 day hx of RLQ abdominal pain with nausea and vomiting onset after pain. Reports no diarrhea, last BM today. Measured temperature at home as high as 101.1. VSS. Significant abdominal surgical hx.

## 2018-06-03 NOTE — Progress Notes (Addendum)
Pharmacy Antibiotic Note  Nancy Decker is a 47 y.o. female admitted on 06/03/2018 with intra-abdominal infection.  Pharmacy has been consulted for Unasyn dosing.  Currently, patient is afebrile and WBC wnl.   Plan: Unasyn 3 gm IV q8h Monitor renal function F/u cultures and length of therapy   Height: 5' (152.4 cm) Weight: 125 lb (56.7 kg) IBW/kg (Calculated) : 45.5  Temp (24hrs), Avg:98.2 F (36.8 C), Min:98.2 F (36.8 C), Max:98.2 F (36.8 C)  Recent Labs  Lab 06/03/18 1429 06/03/18 1432  WBC  --  8.7  CREATININE  --  0.58  LATICACIDVEN 1.15  --     Estimated Creatinine Clearance: 68.6 mL/min (by C-G formula based on SCr of 0.58 mg/dL).    No Known Allergies  Antimicrobials this admission: Unasyn 11/1>>   Microbiology results: 11/1 BCx:  Thank you for allowing pharmacy to be a part of this patient's care.  Willia Craze, Pharmacy Student

## 2018-06-03 NOTE — ED Notes (Signed)
This RN transported patient to CT
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
## 2018-06-03 NOTE — ED Notes (Signed)
Pt. Called for room no answer X3

## 2018-06-04 LAB — BASIC METABOLIC PANEL
ANION GAP: 3 — AB (ref 5–15)
BUN: 11 mg/dL (ref 6–20)
CALCIUM: 8.6 mg/dL — AB (ref 8.9–10.3)
CO2: 28 mmol/L (ref 22–32)
CREATININE: 0.73 mg/dL (ref 0.44–1.00)
Chloride: 110 mmol/L (ref 98–111)
GFR calc non Af Amer: >60 mL/min (ref >60)
Glucose, Bld: 110 mg/dL — ABNORMAL HIGH (ref 70–99)
Potassium: 3.6 mmol/L (ref 3.5–5.1)
SODIUM: 141 mmol/L (ref 135–145)

## 2018-06-04 LAB — CBC
HCT: 37.4 % (ref 36.0–46.0)
Hemoglobin: 12.1 g/dL (ref 12.0–15.0)
MCH: 29.1 pg (ref 26.0–34.0)
MCHC: 32.4 g/dL (ref 30.0–36.0)
MCV: 89.9 fL (ref 80.0–100.0)
NRBC: 0 % (ref 0.0–0.2)
PLATELETS: 249 10*3/uL (ref 150–400)
RBC: 4.16 MIL/uL (ref 3.87–5.11)
RDW: 12.3 % (ref 11.5–15.5)
WBC: 5.7 10*3/uL (ref 4.0–10.5)

## 2018-06-04 LAB — PROTIME-INR
INR: 1.12
Prothrombin Time: 14.3 seconds (ref 11.4–15.2)

## 2018-06-04 LAB — HIV ANTIBODY (ROUTINE TESTING W REFLEX): HIV Screen 4th Generation wRfx: NONREACTIVE

## 2018-06-04 MED ORDER — BENZONATATE 100 MG PO CAPS
100.0000 mg | ORAL_CAPSULE | Freq: Two times a day (BID) | ORAL | Status: DC | PRN
  Administered 2018-06-04: 100 mg via ORAL
  Filled 2018-06-04: qty 1

## 2018-06-04 MED ORDER — MORPHINE SULFATE (PF) 2 MG/ML IV SOLN
1.0000 mg | INTRAVENOUS | Status: DC | PRN
  Administered 2018-06-04 – 2018-06-05 (×4): 1 mg via INTRAVENOUS
  Filled 2018-06-04 (×4): qty 1

## 2018-06-04 NOTE — Progress Notes (Addendum)
Patient had an uneventful day. RN Suezanne Jacquet provided Flu vaccine and pneumonia vaccine. Shift assessment performed after morphine administered by Suezanne Jacquet. Patient was resting majority of the day. Didn't want to be bothered unless it was time for pain meds. Patient was able to ambulate to the bathroom independently. She stayed in bed and wanted to rest and watch TV.
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
## 2018-06-04 NOTE — Progress Notes (Signed)
Pt c/o of throat irritation. Exam didn't show any thrush or inflammation. MD paged and order for tessalon given by MD
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
## 2018-06-05 LAB — CBC
HCT: 40 % (ref 36.0–46.0)
Hemoglobin: 13.1 g/dL (ref 12.0–15.0)
MCH: 29.4 pg (ref 26.0–34.0)
MCHC: 32.8 g/dL (ref 30.0–36.0)
MCV: 89.7 fL (ref 80.0–100.0)
PLATELETS: 251 10*3/uL (ref 150–400)
RBC: 4.46 MIL/uL (ref 3.87–5.11)
RDW: 12.2 % (ref 11.5–15.5)
WBC: 5.2 10*3/uL (ref 4.0–10.5)
nRBC: 0 % (ref 0.0–0.2)

## 2018-06-05 LAB — BASIC METABOLIC PANEL
Anion gap: 8 (ref 5–15)
BUN: 5 mg/dL — ABNORMAL LOW (ref 6–20)
CALCIUM: 8.8 mg/dL — AB (ref 8.9–10.3)
CO2: 22 mmol/L (ref 22–32)
Chloride: 111 mmol/L (ref 98–111)
Creatinine, Ser: 0.57 mg/dL (ref 0.44–1.00)
GFR calc non Af Amer: >60 mL/min (ref >60)
Glucose, Bld: 119 mg/dL — ABNORMAL HIGH (ref 70–99)
Potassium: 3.8 mmol/L (ref 3.5–5.1)
SODIUM: 141 mmol/L (ref 135–145)

## 2018-06-05 LAB — MAGNESIUM: Magnesium: 2.1 mg/dL (ref 1.7–2.4)

## 2018-06-05 MED ORDER — KCL IN DEXTROSE-NACL 20-5-0.45 MEQ/L-%-% IV SOLN
INTRAVENOUS | Status: AC
  Administered 2018-06-05 – 2018-06-07 (×5): via INTRAVENOUS
  Filled 2018-06-05 (×4): qty 1000

## 2018-06-05 MED ORDER — NICOTINE 21 MG/24HR TD PT24
21.0000 mg | MEDICATED_PATCH | Freq: Every day | TRANSDERMAL | Status: DC
  Administered 2018-06-05 – 2018-06-07 (×3): 21 mg via TRANSDERMAL
  Filled 2018-06-05 (×3): qty 1

## 2018-06-05 MED ORDER — OXYCODONE HCL 5 MG PO TABS
5.0000 mg | ORAL_TABLET | ORAL | Status: DC | PRN
  Administered 2018-06-05 – 2018-06-07 (×10): 5 mg via ORAL
  Filled 2018-06-05 (×10): qty 1
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
## 2018-06-06 LAB — CBC
HEMATOCRIT: 42.9 % (ref 36.0–46.0)
HEMOGLOBIN: 13.9 g/dL (ref 12.0–15.0)
MCH: 29.6 pg (ref 26.0–34.0)
MCHC: 32.4 g/dL (ref 30.0–36.0)
MCV: 91.3 fL (ref 80.0–100.0)
Platelets: 290 10*3/uL (ref 150–400)
RBC: 4.7 MIL/uL (ref 3.87–5.11)
RDW: 12.1 % (ref 11.5–15.5)
WBC: 6.4 10*3/uL (ref 4.0–10.5)
nRBC: 0 % (ref 0.0–0.2)

## 2018-06-06 LAB — BASIC METABOLIC PANEL
Anion gap: 2 — ABNORMAL LOW (ref 5–15)
BUN: <5 mg/dL — ABNORMAL LOW (ref 6–20)
CHLORIDE: 110 mmol/L (ref 98–111)
CO2: 29 mmol/L (ref 22–32)
Calcium: 9.1 mg/dL (ref 8.9–10.3)
Creatinine, Ser: 0.73 mg/dL (ref 0.44–1.00)
GFR calc non Af Amer: >60 mL/min (ref >60)
Glucose, Bld: 110 mg/dL — ABNORMAL HIGH (ref 70–99)
POTASSIUM: 4.7 mmol/L (ref 3.5–5.1)
SODIUM: 141 mmol/L (ref 135–145)

## 2018-06-06 LAB — MAGNESIUM: MAGNESIUM: 2.3 mg/dL (ref 1.7–2.4)

## 2018-06-06 MED ORDER — ACETAMINOPHEN 500 MG PO TABS
1000.0000 mg | ORAL_TABLET | Freq: Three times a day (TID) | ORAL | Status: DC
  Administered 2018-06-06 (×2): 1000 mg via ORAL
  Filled 2018-06-06 (×2): qty 2

## 2018-06-06 NOTE — Plan of Care (Signed)
  Problem: Activity: Goal: Risk for activity intolerance will decrease Outcome: Progressing   Problem: Coping: Goal: Level of anxiety will decrease Outcome: Progressing   Problem: Elimination: Goal: Will not experience complications related to bowel motility Outcome: Progressing   
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
## 2018-06-06 NOTE — Progress Notes (Signed)
Pt c/o increased abdominal pain/cramping after taking full liquids. Also c/o diarrhea. Modena Jansky, [place] notified. Orders received.

## 2018-06-07 DIAGNOSIS — K5792 Diverticulitis of intestine, part unspecified, without perforation or abscess without bleeding: Secondary | ICD-10-CM

## 2018-06-07 LAB — C DIFFICILE QUICK SCREEN W PCR REFLEX
C DIFFICILE (CDIFF) INTERP: NOT DETECTED
C DIFFICILE (CDIFF) TOXIN: NEGATIVE
C DIFFICLE (CDIFF) ANTIGEN: NEGATIVE

## 2018-06-07 MED ORDER — CIPROFLOXACIN HCL 500 MG PO TABS: 500.0000 mg | ORAL_TABLET | Freq: Two times a day (BID) | ORAL | 0 refills | Status: DC

## 2018-06-07 MED ORDER — METRONIDAZOLE 500 MG PO TABS: 500.0000 mg | ORAL_TABLET | Freq: Three times a day (TID) | ORAL | 0 refills | Status: AC

## 2018-06-07 MED ORDER — OXYCODONE HCL 5 MG PO TABS
5.0000 mg | ORAL_TABLET | Freq: Four times a day (QID) | ORAL | Status: DC | PRN
  Administered 2018-06-07: 5 mg via ORAL
  Filled 2018-06-07: qty 1

## 2018-06-07 NOTE — Discharge Instructions (Signed)
Follow with Primary MD Lorelee Market, MD in 7 days   Get CBC, CMP, 2 view Chest X ray -  checked  by Primary MD or SNF MD in 5-7 days    Activity: As tolerated with Full fall precautions use walker/cane & assistance as needed  Disposition Home    Diet: Soft diet for a week, then advance to regular consistency heart healthy diet as tolerated after 1 week.  Special Instructions: If you have smoked or chewed Tobacco  in the last 2 yrs please stop smoking, stop any regular Alcohol  and or any Recreational drug use.  On your next visit with your primary care physician please Get Medicines reviewed and adjusted.  Please request your Prim.MD to go over all Hospital Tests and Procedure/Radiological results at the follow up, please get all Hospital records sent to your Prim MD by signing hospital release before you go home.  If you experience worsening of your admission symptoms, develop shortness of breath, life threatening emergency, suicidal or homicidal thoughts you must seek medical attention immediately by calling 911 or calling your MD immediately  if symptoms less severe.  You Must read complete instructions/literature along with all the possible adverse reactions/side effects for all the Medicines you take and that have been prescribed to you. Take any new Medicines after you have completely understood and accpet all the possible adverse reactions/side effects.    Do not drive when taking Pain medications.  Do not take more than prescribed Pain, Sleep and Anxiety Medications
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
## 2018-06-08 LAB — CULTURE, BLOOD (ROUTINE X 2)
CULTURE: NO GROWTH
Culture: NO GROWTH
Special Requests: ADEQUATE
Special Requests: ADEQUATE
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
## 2019-05-25 ENCOUNTER — Emergency Department
Admission: EM | Admit: 2019-05-25 | Discharge: 2019-05-25 | Disposition: A | Discharge status: Home / Self Care | Payer: Self-pay | Attending: Emergency Medicine | Admitting: Emergency Medicine

## 2019-05-25 ENCOUNTER — Other Ambulatory Visit: Payer: Self-pay

## 2019-05-25 ENCOUNTER — Emergency Department: Payer: Self-pay

## 2019-05-25 DIAGNOSIS — Z20822 Contact with and (suspected) exposure to covid-19: Secondary | ICD-10-CM

## 2019-05-25 DIAGNOSIS — Z20828 Contact with and (suspected) exposure to other viral communicable diseases: Secondary | ICD-10-CM | POA: Insufficient documentation

## 2019-05-25 DIAGNOSIS — R509 Fever, unspecified: Secondary | ICD-10-CM | POA: Insufficient documentation

## 2019-05-25 DIAGNOSIS — F17210 Nicotine dependence, cigarettes, uncomplicated: Secondary | ICD-10-CM | POA: Insufficient documentation

## 2019-05-25 DIAGNOSIS — R05 Cough: Secondary | ICD-10-CM | POA: Insufficient documentation

## 2019-05-25 DIAGNOSIS — J449 Chronic obstructive pulmonary disease, unspecified: Secondary | ICD-10-CM | POA: Insufficient documentation

## 2019-05-25 DIAGNOSIS — M791 Myalgia, unspecified site: Secondary | ICD-10-CM | POA: Insufficient documentation

## 2019-05-25 DIAGNOSIS — R918 Other nonspecific abnormal finding of lung field: Secondary | ICD-10-CM

## 2019-05-25 LAB — COMPREHENSIVE METABOLIC PANEL
ALT: 14 U/L (ref 0–44)
AST: 15 U/L (ref 15–41)
Albumin: 4.1 g/dL (ref 3.5–5.0)
Alkaline Phosphatase: 64 U/L (ref 38–126)
Anion gap: 13 (ref 5–15)
BUN: 13 mg/dL (ref 6–20)
CO2: 21 mmol/L — ABNORMAL LOW (ref 22–32)
Calcium: 9.6 mg/dL (ref 8.9–10.3)
Chloride: 104 mmol/L (ref 98–111)
Creatinine, Ser: 0.78 mg/dL (ref 0.44–1.00)
GFR calc Af Amer: >60 mL/min (ref >60)
GFR calc non Af Amer: >60 mL/min (ref >60)
Glucose, Bld: 100 mg/dL — ABNORMAL HIGH (ref 70–99)
Potassium: 3.9 mmol/L (ref 3.5–5.1)
Sodium: 138 mmol/L (ref 135–145)
Total Bilirubin: 0.5 mg/dL (ref 0.3–1.2)
Total Protein: 8 g/dL (ref 6.5–8.1)

## 2019-05-25 LAB — CBC WITH DIFFERENTIAL/PLATELET
Abs Immature Granulocytes: 0.06 10*3/uL (ref 0.00–0.07)
Basophils Absolute: 0 10*3/uL (ref 0.0–0.1)
Basophils Relative: 0 %
Eosinophils Absolute: 0 10*3/uL (ref 0.0–0.5)
Eosinophils Relative: 0 %
HCT: 41.6 % (ref 36.0–46.0)
Hemoglobin: 14 g/dL (ref 12.0–15.0)
Immature Granulocytes: 0 %
Lymphocytes Relative: 12 %
Lymphs Abs: 1.8 10*3/uL (ref 0.7–4.0)
MCH: 29.4 pg (ref 26.0–34.0)
MCHC: 33.7 g/dL (ref 30.0–36.0)
MCV: 87.2 fL (ref 80.0–100.0)
Monocytes Absolute: 0.8 10*3/uL (ref 0.1–1.0)
Monocytes Relative: 5 %
Neutro Abs: 12.2 10*3/uL — ABNORMAL HIGH (ref 1.7–7.7)
Neutrophils Relative %: 83 %
Platelets: 322 10*3/uL (ref 150–400)
RBC: 4.77 MIL/uL (ref 3.87–5.11)
RDW: 12.1 % (ref 11.5–15.5)
WBC: 14.9 10*3/uL — ABNORMAL HIGH (ref 4.0–10.5)
nRBC: 0 % (ref 0.0–0.2)

## 2019-05-25 LAB — LACTIC ACID, PLASMA: Lactic Acid, Venous: 1.4 mmol/L (ref 0.5–1.9)

## 2019-05-25 MED ORDER — IBUPROFEN 600 MG PO TABS
600.0000 mg | ORAL_TABLET | Freq: Once | ORAL | Status: AC
  Administered 2019-05-25: 600 mg via ORAL
  Filled 2019-05-25: qty 1

## 2019-05-25 MED ORDER — AZITHROMYCIN 250 MG PO TABS: ORAL_TABLET | ORAL | 0 refills | Status: DC

## 2019-05-25 MED ORDER — BENZONATATE 100 MG PO CAPS: 100.0000 mg | ORAL_CAPSULE | Freq: Four times a day (QID) | ORAL | 0 refills | Status: AC | PRN

## 2019-05-25 MED ORDER — ALBUTEROL SULFATE HFA 108 (90 BASE) MCG/ACT IN AERS: 2.0000 | INHALATION_SPRAY | RESPIRATORY_TRACT | 1 refills | Status: DC | PRN

## 2019-05-25 MED ORDER — PSEUDOEPH-BROMPHEN-DM 30-2-10 MG/5ML PO SYRP: 10.0000 mL | ORAL_SOLUTION | Freq: Four times a day (QID) | ORAL | 0 refills | Status: DC | PRN
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
## 2019-05-25 NOTE — ED Notes (Signed)
See triage note  States she developed cough,body aches and fever about 10 days ago   States she has been taking OTC meds for fever  Was told at testing station that she needed to come her d/t fever  Pt is afebrile on arrival

## 2019-05-25 NOTE — ED Notes (Signed)
E-signature not working at this time. Pt verbalized understanding of D/C instructions, prescriptions and follow up care with no further questions at this time. Pt in NAD and ambulatory at time of D/C.  

## 2019-05-25 NOTE — ED Triage Notes (Addendum)
Reports fever, body aches, exertional SOB X 10 days. Pt has not been tested for COVID. Pt tearful. Pt alert and oriented X4, cooperative, RR even and unlabored, color WNL. Pt in NAD. Took tylenol and ibuprofen within 2 hours of arrival.

## 2019-05-25 NOTE — ED Notes (Signed)
Pt refusing d/c vitals at time of d/c

## 2019-05-27 LAB — NOVEL CORONAVIRUS, NAA (HOSP ORDER, SEND-OUT TO REF LAB; TAT 18-24 HRS): SARS-CoV-2, NAA: NOT DETECTED

## 2020-07-27 ENCOUNTER — Other Ambulatory Visit: Payer: Self-pay

## 2020-07-27 ENCOUNTER — Emergency Department (HOSPITAL_COMMUNITY)
Admission: EM | Admit: 2020-07-27 | Discharge: 2020-07-28 | Disposition: A | Discharge status: Home / Self Care | Payer: Medicaid Other | Attending: Emergency Medicine | Admitting: Emergency Medicine

## 2020-07-27 ENCOUNTER — Emergency Department (HOSPITAL_COMMUNITY): Payer: Medicaid Other

## 2020-07-27 ENCOUNTER — Encounter (HOSPITAL_COMMUNITY): Payer: Self-pay | Admitting: Emergency Medicine

## 2020-07-27 DIAGNOSIS — Z23 Encounter for immunization: Secondary | ICD-10-CM | POA: Insufficient documentation

## 2020-07-27 DIAGNOSIS — M25562 Pain in left knee: Secondary | ICD-10-CM | POA: Insufficient documentation

## 2020-07-27 DIAGNOSIS — S60512A Abrasion of left hand, initial encounter: Secondary | ICD-10-CM | POA: Diagnosis not present

## 2020-07-27 DIAGNOSIS — W1839XA Other fall on same level, initial encounter: Secondary | ICD-10-CM | POA: Diagnosis not present

## 2020-07-27 DIAGNOSIS — W19XXXA Unspecified fall, initial encounter: Secondary | ICD-10-CM

## 2020-07-27 DIAGNOSIS — F17210 Nicotine dependence, cigarettes, uncomplicated: Secondary | ICD-10-CM | POA: Diagnosis not present

## 2020-07-27 DIAGNOSIS — M25569 Pain in unspecified knee: Secondary | ICD-10-CM

## 2020-07-27 DIAGNOSIS — J449 Chronic obstructive pulmonary disease, unspecified: Secondary | ICD-10-CM | POA: Diagnosis not present

## 2020-07-27 DIAGNOSIS — S6992XA Unspecified injury of left wrist, hand and finger(s), initial encounter: Secondary | ICD-10-CM | POA: Diagnosis present

## 2020-07-27 DIAGNOSIS — M79641 Pain in right hand: Secondary | ICD-10-CM

## 2020-07-27 NOTE — ED Triage Notes (Signed)
Patient reports injury to left hand and left knee from a fall this evening , no LOC/ambulatory . No deformity .

## 2020-07-28 MED ORDER — OXYCODONE-ACETAMINOPHEN 5-325 MG PO TABS
2.0000 | ORAL_TABLET | Freq: Once | ORAL | Status: AC
  Administered 2020-07-28: 2 via ORAL
  Filled 2020-07-28: qty 2

## 2020-07-28 MED ORDER — IBUPROFEN 400 MG PO TABS
400.0000 mg | ORAL_TABLET | Freq: Once | ORAL | Status: AC
  Administered 2020-07-28: 400 mg via ORAL
  Filled 2020-07-28: qty 1

## 2020-07-28 MED ORDER — TETANUS-DIPHTH-ACELL PERTUSSIS 5-2.5-18.5 LF-MCG/0.5 IM SUSY
0.5000 mL | PREFILLED_SYRINGE | Freq: Once | INTRAMUSCULAR | Status: AC
  Administered 2020-07-28: 0.5 mL via INTRAMUSCULAR
  Filled 2020-07-28: qty 0.5

## 2020-07-28 NOTE — ED Notes (Signed)
Pt states wounds are clean and nonbleeding at this time.
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
## 2020-07-28 NOTE — ED Notes (Signed)
Pt declines WC transport to lobby; also declines bag in which to place belongings for easier transport.  Pt wanting pain prescription; Dr. Dayna Barker says pt to take Tylenol or ibuprofen for pain.  On departure, pt states, "This is the sorriest service I have ever received.  You people have no business being in the healthcare business."  Pt now complaining that she did not receive wound care or have the Ace wrap applied to her arm, after previously declining to have this done by nursing staff.

## 2020-07-28 NOTE — ED Notes (Signed)
4 in Ace wrap to bedside; pt declines to have wound wrapped at this time; says will apply at home.

## 2020-07-28 NOTE — ED Notes (Signed)
Called pt multiple times and no answer

## 2021-03-03 ENCOUNTER — Emergency Department (HOSPITAL_COMMUNITY): Payer: Medicaid Other

## 2021-03-03 ENCOUNTER — Emergency Department (HOSPITAL_COMMUNITY)
Admission: EM | Admit: 2021-03-03 | Discharge: 2021-03-04 | Disposition: A | Discharge status: Home / Self Care | Payer: Medicaid Other | Attending: Emergency Medicine | Admitting: Emergency Medicine

## 2021-03-03 ENCOUNTER — Ambulatory Visit: Payer: Self-pay | Admitting: Family Medicine

## 2021-03-03 DIAGNOSIS — U071 COVID-19: Secondary | ICD-10-CM | POA: Diagnosis not present

## 2021-03-03 DIAGNOSIS — R079 Chest pain, unspecified: Secondary | ICD-10-CM | POA: Diagnosis not present

## 2021-03-03 DIAGNOSIS — Z28311 Partially vaccinated for covid-19: Secondary | ICD-10-CM | POA: Insufficient documentation

## 2021-03-03 DIAGNOSIS — R059 Cough, unspecified: Secondary | ICD-10-CM | POA: Insufficient documentation

## 2021-03-03 DIAGNOSIS — R0602 Shortness of breath: Secondary | ICD-10-CM | POA: Diagnosis not present

## 2021-03-03 DIAGNOSIS — M791 Myalgia, unspecified site: Secondary | ICD-10-CM | POA: Insufficient documentation

## 2021-03-03 NOTE — ED Triage Notes (Signed)
Pt states home test C+ today, endorses SHOB, CP w inspiration, productive cough, generalized muscle aches- worse in legs, NV. Pt advises she had first dose of vaccine 2wks ago

## 2021-03-03 NOTE — ED Provider Notes (Signed)
Emergency Medicine Provider Triage Evaluation Note  Nancy Decker , a 50 y.o. female  was evaluated in triage.  Pt complains of COVID symptoms x3 days.  Patient reports myalgias, cough, congestion, shortness of breath, chest pain, nausea, vomiting and diarrhea.  Patient is a current smoker..  Review of Systems  Positive: Myalgias, cough, congestion, shortness of breath, chest pain, nausea Negative: Weakness, dizziness, syncope  Physical Exam  BP (!) 132/100 (BP Location: Right Arm)   Pulse 100   Temp 98.8 F (37.1 C) (Oral)   Resp 20   SpO2 98%  Gen:   Awake, no distress   Resp:  Mild increased work of breathing, coarse breath sounds throughout  MSK:   Moves extremities without difficulty  Other:  Abdomen soft and mildly tender without rebound or guarding  Medical Decision Making  Medically screening exam initiated at 11:14 PM.  Appropriate orders placed.  Kenni C Gossard was informed that the remainder of the evaluation will be completed by another provider, this initial triage assessment does not replace that evaluation, and the importance of remaining in the ED until their evaluation is complete.  COVID-positive   Anneli Bing, Gwenlyn Perking 03/03/21 [place], [place], DO 03/03/21 2320

## 2021-03-04 LAB — CBC WITH DIFFERENTIAL/PLATELET
Abs Immature Granulocytes: 0.01 10*3/uL (ref 0.00–0.07)
Basophils Absolute: 0 10*3/uL (ref 0.0–0.1)
Basophils Relative: 0 %
Eosinophils Absolute: 0 10*3/uL (ref 0.0–0.5)
Eosinophils Relative: 0 %
HCT: 43.9 % (ref 36.0–46.0)
Hemoglobin: 14.7 g/dL (ref 12.0–15.0)
Immature Granulocytes: 0 %
Lymphocytes Relative: 33 %
Lymphs Abs: 1.4 10*3/uL (ref 0.7–4.0)
MCH: 29.2 pg (ref 26.0–34.0)
MCHC: 33.5 g/dL (ref 30.0–36.0)
MCV: 87.1 fL (ref 80.0–100.0)
Monocytes Absolute: 0.3 10*3/uL (ref 0.1–1.0)
Monocytes Relative: 8 %
Neutro Abs: 2.6 10*3/uL (ref 1.7–7.7)
Neutrophils Relative %: 59 %
Platelets: 215 10*3/uL (ref 150–400)
RBC: 5.04 MIL/uL (ref 3.87–5.11)
RDW: 12.3 % (ref 11.5–15.5)
WBC: 4.4 10*3/uL (ref 4.0–10.5)
nRBC: 0 % (ref 0.0–0.2)

## 2021-03-04 LAB — COMPREHENSIVE METABOLIC PANEL
ALT: 33 U/L (ref 0–44)
AST: 32 U/L (ref 15–41)
Albumin: 3.6 g/dL (ref 3.5–5.0)
Alkaline Phosphatase: 54 U/L (ref 38–126)
Anion gap: 11 (ref 5–15)
BUN: 11 mg/dL (ref 6–20)
CO2: 24 mmol/L (ref 22–32)
Calcium: 8.8 mg/dL — ABNORMAL LOW (ref 8.9–10.3)
Chloride: 101 mmol/L (ref 98–111)
Creatinine, Ser: 0.87 mg/dL (ref 0.44–1.00)
GFR, Estimated: >60 mL/min (ref >60)
Glucose, Bld: 135 mg/dL — ABNORMAL HIGH (ref 70–99)
Potassium: 3.1 mmol/L — ABNORMAL LOW (ref 3.5–5.1)
Sodium: 136 mmol/L (ref 135–145)
Total Bilirubin: 0.4 mg/dL (ref 0.3–1.2)
Total Protein: 6.8 g/dL (ref 6.5–8.1)

## 2021-03-04 LAB — TROPONIN I (HIGH SENSITIVITY): Troponin I (High Sensitivity): 4 ng/L (ref <18)

## 2021-03-04 LAB — LIPASE, BLOOD: Lipase: 24 U/L (ref 11–51)

## 2021-03-04 NOTE — ED Notes (Signed)
Pt called 2x no answer 

## 2021-03-04 NOTE — ED Notes (Signed)
Pt called to be brought back to room. No response.

## 2021-03-04 NOTE — ED Notes (Signed)
Patient returned

## 2021-03-04 NOTE — Telephone Encounter (Signed)
Pt called into the community line stating she has COVID, pt is worried because she is on oxygen and needed some further advice  Pt. Reports she went to ED last night and left before treatment completed. States she "couldn't wait any longer." Reports she is going to [facility] ED today. Answer Assessment - Initial Assessment Questions 1. COVID-19 DIAGNOSIS: "Who made your COVID-19 diagnosis?" "Was it confirmed by a positive lab test or self-test?" If not diagnosed by a doctor (or NP/PA), ask "Are there lots of cases (community spread) where you live?" Note: See public health department website, if unsure.     Home test 2. COVID-19 EXPOSURE: "Was there any known exposure to COVID before the symptoms began?" CDC Definition of close contact: within 6 feet (2 meters) for a total of 15 minutes or more over a 24-hour period.      No 3. ONSET: "When did the COVID-19 symptoms start?"      Friday 4. WORST SYMPTOM: "What is your worst symptom?" (e.g., cough, fever, shortness of breath, muscle aches)     Cough 5. COUGH: "Do you have a cough?" If Yes, ask: "How bad is the cough?"       Yes 6. FEVER: "Do you have a fever?" If Yes, ask: "What is your temperature, how was it measured, and when did it start?"     Yes 7. RESPIRATORY STATUS: "Describe your breathing?" (e.g., shortness of breath, wheezing, unable to speak)      Shortness of breath 8. BETTER-SAME-WORSE: "Are you getting better, staying the same or getting worse compared to yesterday?"  If getting worse, ask, "In what way?"     Better 9. HIGH RISK DISEASE: "Do you have any chronic medical problems?" (e.g., asthma, heart or lung disease, weak immune system, obesity, etc.)     Lung issues 10. VACCINE: "Have you had the COVID-19 vaccine?" If Yes, ask: "Which one, how many shots, when did you get it?"       No 11. BOOSTER: "Have you received your COVID-19 booster?" If Yes, ask: "Which one and when did you get it?"       No 12. PREGNANCY: "Is there  any chance you are pregnant?" "When was your last menstrual period?"       No 13. OTHER SYMPTOMS: "Do you have any other symptoms?"  (e.g., chills, fatigue, headache, loss of smell or taste, muscle pain, sore throat)       Fatigue, body aches 14. O2 SATURATION MONITOR:  "Do you use an oxygen saturation monitor (pulse oximeter) at home?" If Yes, ask "What is your reading (oxygen level) today?" "What is your usual oxygen saturation reading?" (e.g., 95%)       80-94%  Protocols used: Coronavirus (COVID-19) Diagnosed or Suspected-A-AH

## 2021-03-04 NOTE — ED Notes (Signed)
Pt called 3x no answer  

## 2021-03-13 ENCOUNTER — Emergency Department
Admission: EM | Admit: 2021-03-13 | Discharge: 2021-03-13 | Disposition: A | Discharge status: Home / Self Care | Payer: Medicaid Other | Attending: Emergency Medicine | Admitting: Emergency Medicine

## 2021-03-13 ENCOUNTER — Emergency Department: Payer: Medicaid Other

## 2021-03-13 ENCOUNTER — Other Ambulatory Visit: Payer: Self-pay

## 2021-03-13 DIAGNOSIS — S99921A Unspecified injury of right foot, initial encounter: Secondary | ICD-10-CM | POA: Diagnosis present

## 2021-03-13 DIAGNOSIS — J449 Chronic obstructive pulmonary disease, unspecified: Secondary | ICD-10-CM | POA: Diagnosis not present

## 2021-03-13 DIAGNOSIS — S92354A Nondisplaced fracture of fifth metatarsal bone, right foot, initial encounter for closed fracture: Secondary | ICD-10-CM | POA: Diagnosis not present

## 2021-03-13 DIAGNOSIS — F17210 Nicotine dependence, cigarettes, uncomplicated: Secondary | ICD-10-CM | POA: Insufficient documentation

## 2021-03-13 DIAGNOSIS — X501XXA Overexertion from prolonged static or awkward postures, initial encounter: Secondary | ICD-10-CM | POA: Insufficient documentation

## 2021-03-13 DIAGNOSIS — T1490XA Injury, unspecified, initial encounter: Secondary | ICD-10-CM

## 2021-03-13 MED ORDER — HYDROCODONE-ACETAMINOPHEN 5-325 MG PO TABS
1.0000 | ORAL_TABLET | Freq: Once | ORAL | Status: AC
  Administered 2021-03-13: 1 via ORAL
  Filled 2021-03-13: qty 1

## 2021-03-13 MED ORDER — HYDROCODONE-ACETAMINOPHEN 5-325 MG PO TABS
1.0000 | ORAL_TABLET | Freq: Four times a day (QID) | ORAL | 0 refills | Status: DC | PRN
Start: 2021-03-13 — End: 2022-10-26

## 2021-03-13 MED ORDER — IBUPROFEN 600 MG PO TABS: 600.0000 mg | ORAL_TABLET | Freq: Three times a day (TID) | ORAL | 0 refills | Status: DC | PRN

## 2021-03-13 MED ORDER — IBUPROFEN 600 MG PO TABS
600.0000 mg | ORAL_TABLET | Freq: Once | ORAL | Status: AC
  Administered 2021-03-13: 600 mg via ORAL
  Filled 2021-03-13: qty 1

## 2021-03-13 NOTE — Discharge Instructions (Addendum)
1.  You may take pain medicines as needed (Motrin/Norco). 2.  Keep splint clean and dry.  Elevate right foot and apply ice over splint several times daily. 3.  Use walker to help you balance as you walk.  Do not bear weight on right foot until seen by the podiatrist. 4.  Return to the ER for worsening symptoms, persistent vomiting, difficulty breathing or other concerns.

## 2021-03-13 NOTE — ED Triage Notes (Signed)
Pt arrived to ed via pov with c/o rt foot pain. Pt reports she stepped off curb when pick up dog and twisted ankle and felt something pop in foot. Pt has ice applied to area at this time. Circulation WDL
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
## 2022-02-02 IMAGING — CR DG FOOT COMPLETE 3+V*R*
3 series · 3 of 3 positions shown · non-contrast
Comparison: None.

CLINICAL DATA: Right foot pain

EXAM:
RIGHT FOOT COMPLETE - 3+ VIEW

[foot ap]
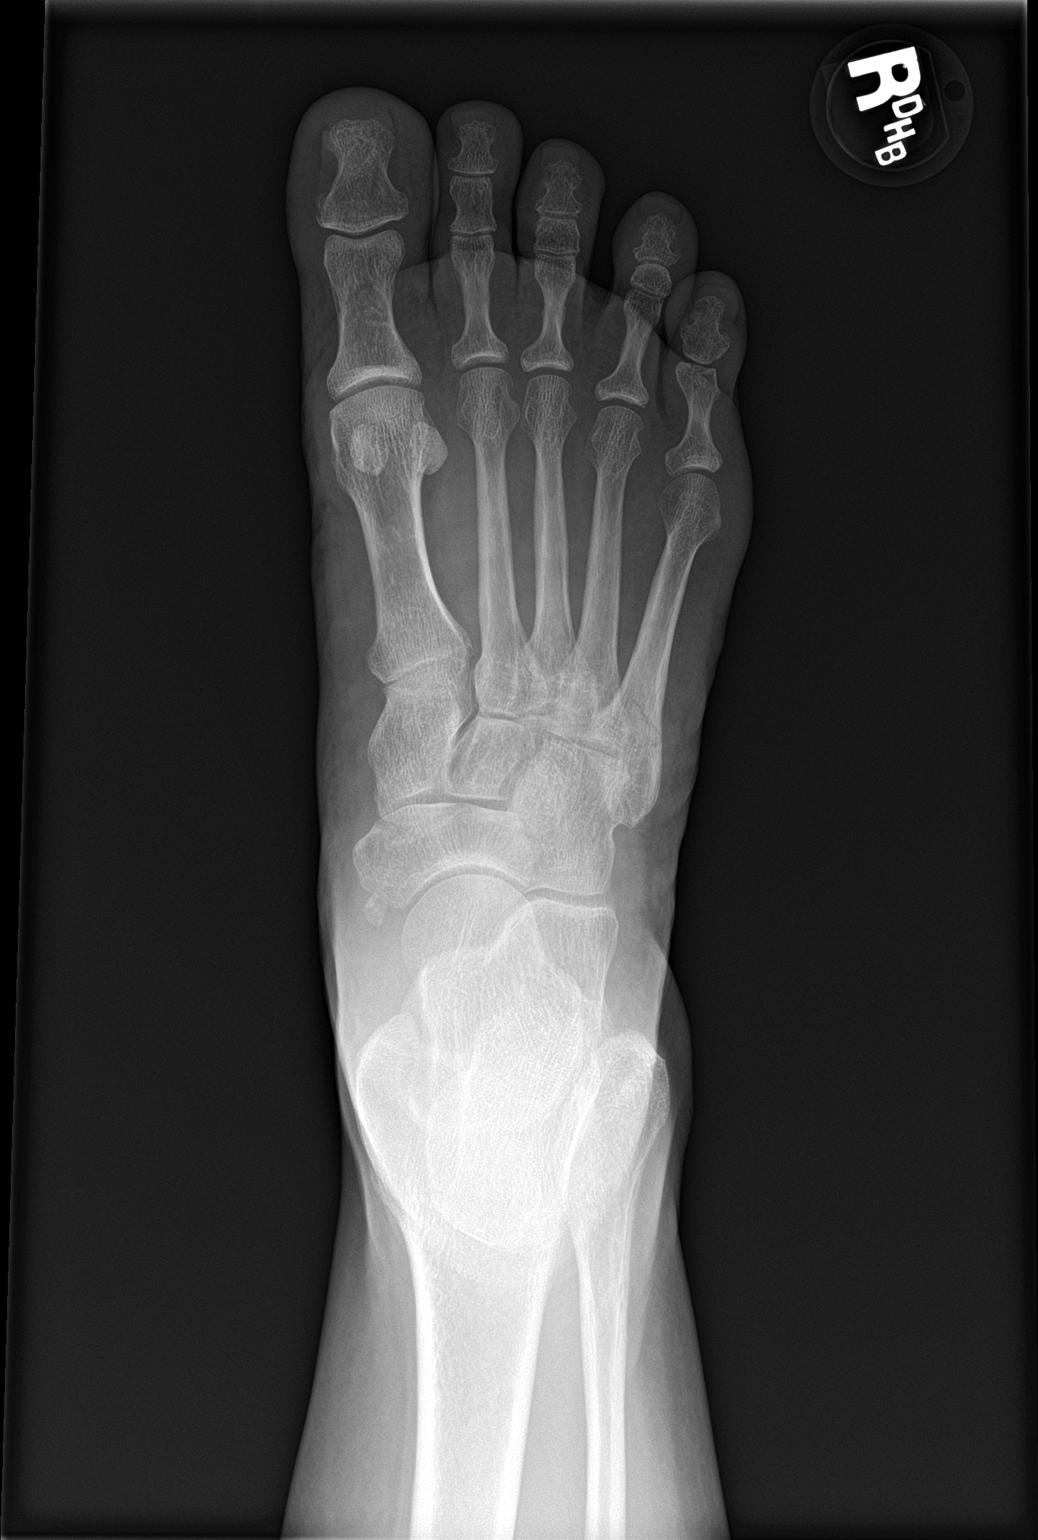

[foot obl]
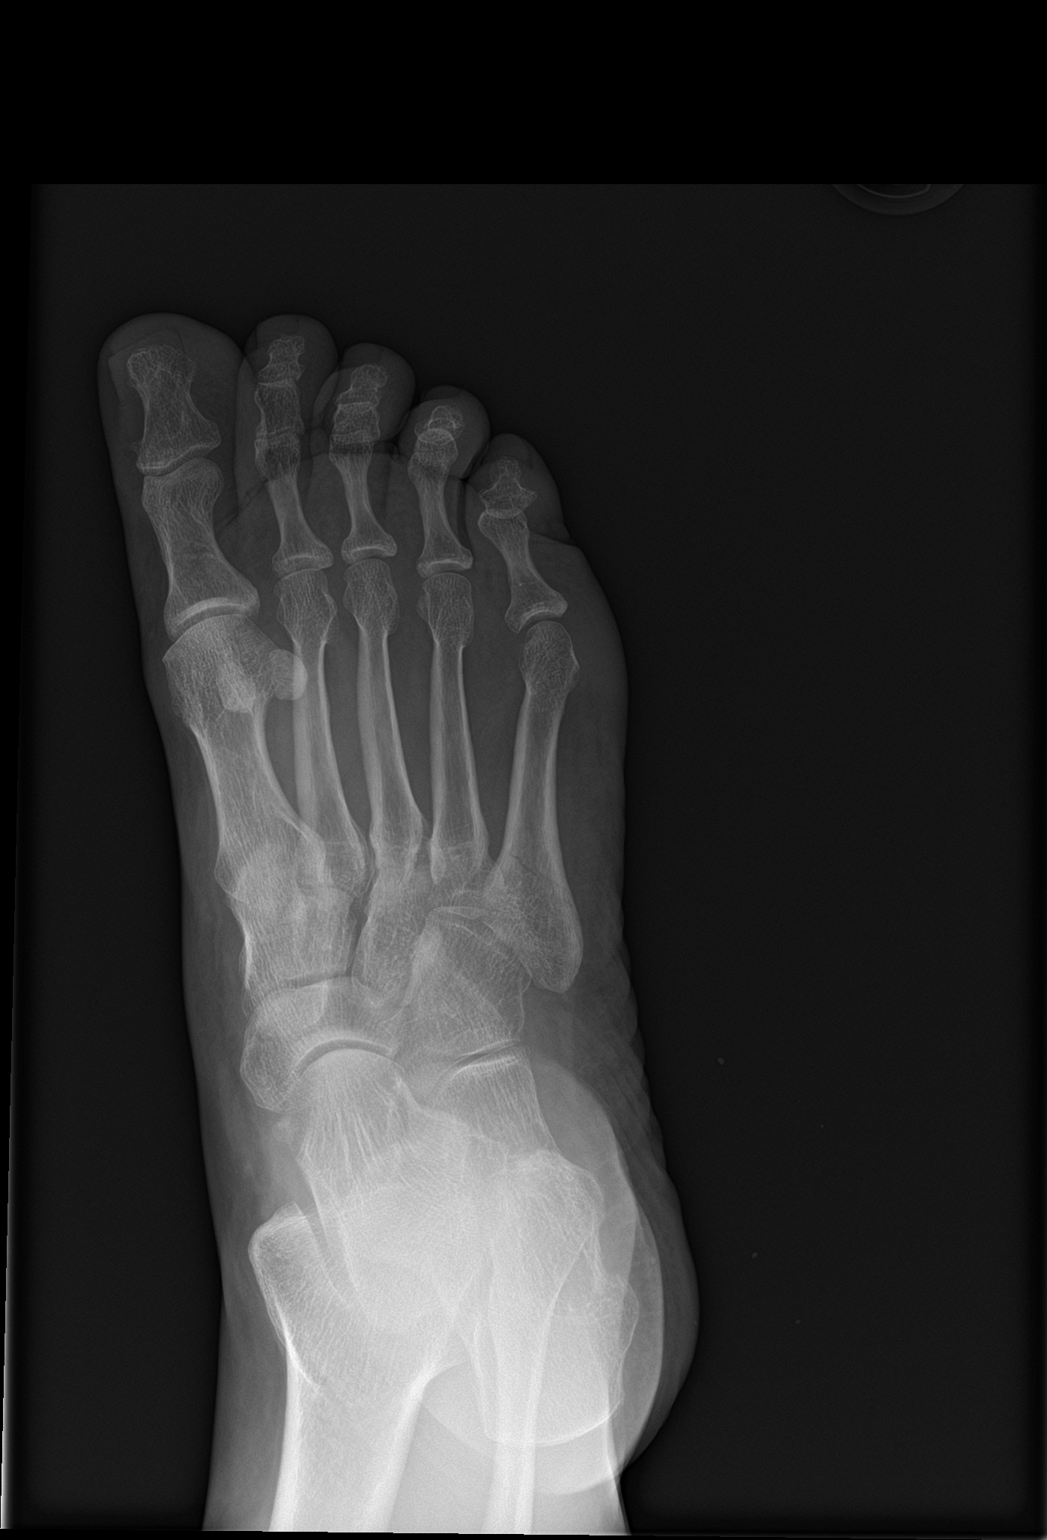

[foot lat]
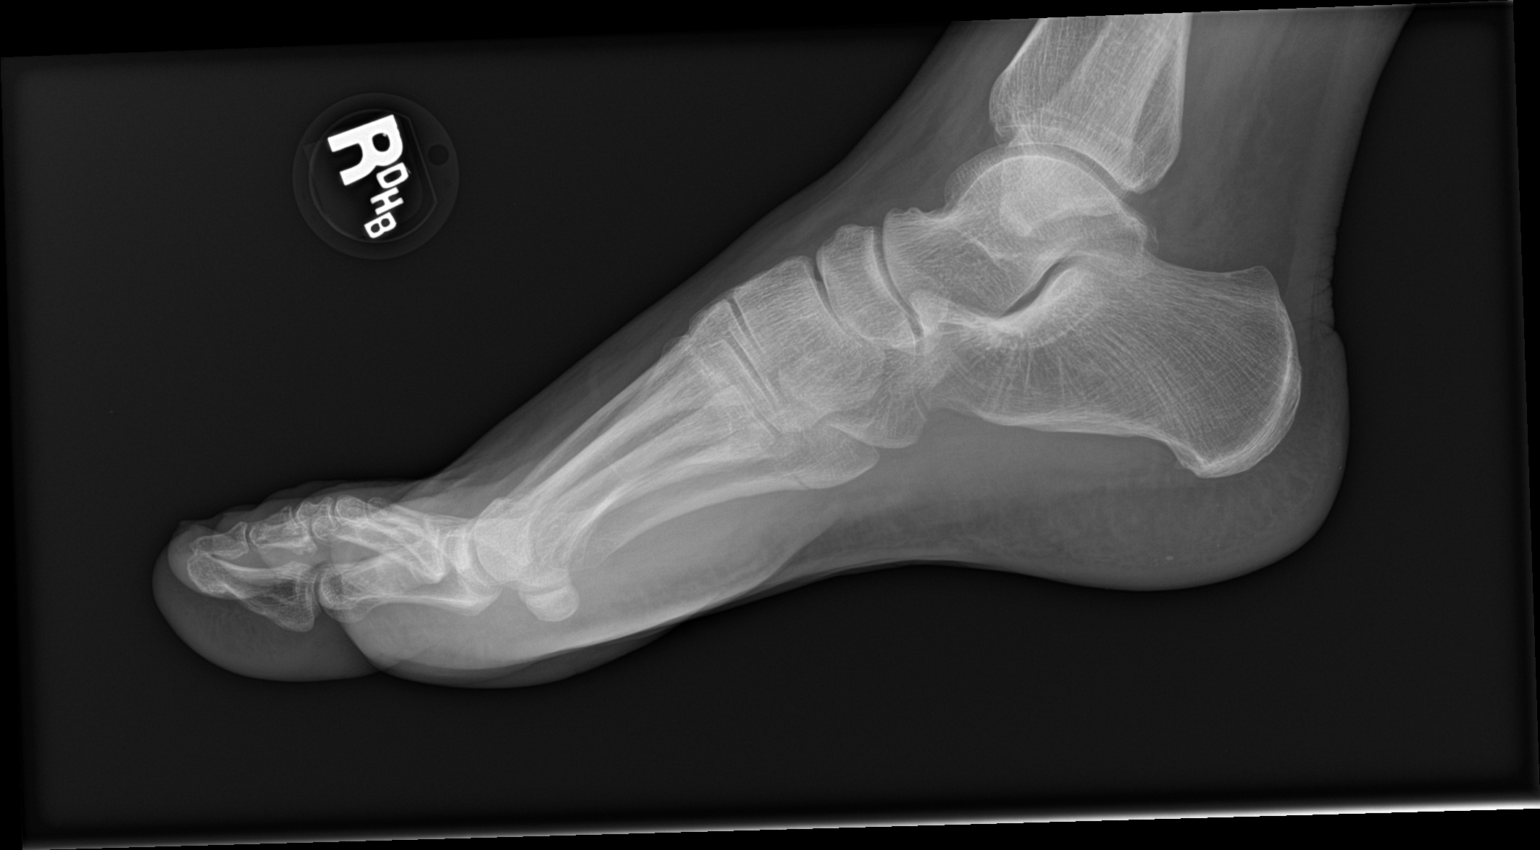

[3 of 3 positions shown; findings below may reference images not displayed]

FINDINGS: Nondisplaced fracture involving the proximal shaft of the 5th
metatarsal.

The joint spaces are preserved.

Visualized soft tissues are within normal limits.
IMPRESSION: Nondisplaced fracture involving the proximal 5th metatarsal.

## 2022-07-20 ENCOUNTER — Ambulatory Visit: Payer: Self-pay | Admitting: Internal Medicine

## 2022-09-15 ENCOUNTER — Ambulatory Visit: Payer: Self-pay | Admitting: Nurse Practitioner

## 2022-09-24 ENCOUNTER — Ambulatory Visit (HOSPITAL_COMMUNITY): Payer: Self-pay

## 2022-09-24 ENCOUNTER — Ambulatory Visit: Payer: Self-pay

## 2022-09-24 NOTE — Telephone Encounter (Signed)
  Chief Complaint: SOB Symptoms: SOB at rest and when talking, back pain Frequency: 1 month Pertinent Negatives: NA Disposition: []$ ED /[x]$ Urgent Care (no appt availability in office) / [x]$ Appointment(In office/virtual)/ []$  [facility]/ []$ Home Care/ []$ Refused Recommended Disposition /[]$ [place]/ []$  Follow-up with PCP Additional Notes: pt was wanting to get new PCP set up, advised pt based on sx she needed to be seen today for evaluation of breathing, scheduled UC appt at 1900 today and also scheduled NPA at [facility] on 10/08/22 at 0920. Pt was provided with address.   Reason for Disposition  [1] MODERATE difficulty breathing (e.g., speaks in phrases, SOB even at rest, pulse 100-120) AND [2] NEW-onset or WORSE than normal  Answer Assessment - Initial Assessment Questions 1. RESPIRATORY STATUS: "Describe your breathing?" (e.g., wheezing, shortness of breath, unable to speak, severe coughing)      SOB  2. ONSET: "When did this breathing problem begin?"      1 month  3. PATTERN "Does the difficult breathing come and go, or has it been constant since it started?"      Comes and goes  4. SEVERITY: "How bad is your breathing?" (e.g., mild, moderate, severe)    - MILD: No SOB at rest, mild SOB with walking, speaks normally in sentences, can lie down, no retractions, pulse < 100.    - MODERATE: SOB at rest, SOB with minimal exertion and prefers to sit, cannot lie down flat, speaks in phrases, mild retractions, audible wheezing, pulse 100-120.    - SEVERE: Very SOB at rest, speaks in single words, struggling to breathe, sitting hunched forward, retractions, pulse > 120      Moderate  7. LUNG HISTORY: "Do you have any history of lung disease?"  (e.g., pulmonary embolus, asthma, emphysema)     Spot on lung 4-5 years ago  9. OTHER SYMPTOMS: "Do you have any other symptoms? (e.g., dizziness, runny nose, cough, chest pain, fever)  Protocols used: Breathing Difficulty-A-AH

## 2022-10-08 ENCOUNTER — Ambulatory Visit: Payer: Medicaid Other | Admitting: Family

## 2022-10-26 ENCOUNTER — Ambulatory Visit (INDEPENDENT_AMBULATORY_CARE_PROVIDER_SITE_OTHER): Payer: Medicaid Other | Admitting: Family

## 2022-10-26 ENCOUNTER — Encounter: Payer: Self-pay | Admitting: Family

## 2022-10-26 VITALS — BP 138/83 | HR 94 | Temp 98.0°F | Ht 60.0 in | Wt 176.0 lb

## 2022-10-26 DIAGNOSIS — F17200 Nicotine dependence, unspecified, uncomplicated: Secondary | ICD-10-CM

## 2022-10-26 DIAGNOSIS — I10 Essential (primary) hypertension: Secondary | ICD-10-CM | POA: Insufficient documentation

## 2022-10-26 DIAGNOSIS — G4700 Insomnia, unspecified: Secondary | ICD-10-CM | POA: Diagnosis not present

## 2022-10-26 DIAGNOSIS — J449 Chronic obstructive pulmonary disease, unspecified: Secondary | ICD-10-CM | POA: Diagnosis not present

## 2022-10-26 DIAGNOSIS — G4733 Obstructive sleep apnea (adult) (pediatric): Secondary | ICD-10-CM | POA: Diagnosis not present

## 2022-10-26 MED ORDER — ALBUTEROL SULFATE HFA 108 (90 BASE) MCG/ACT IN AERS: 2.0000 | INHALATION_SPRAY | Freq: Four times a day (QID) | RESPIRATORY_TRACT | 0 refills | Status: DC | PRN

## 2022-10-26 MED ORDER — BUDESONIDE-FORMOTEROL FUMARATE 160-4.5 MCG/ACT IN AERO: 2.0000 | INHALATION_SPRAY | Freq: Two times a day (BID) | RESPIRATORY_TRACT | 3 refills | Status: DC

## 2022-10-26 MED ORDER — TRAZODONE HCL 50 MG PO TABS: 50.0000 mg | ORAL_TABLET | Freq: Every evening | ORAL | 3 refills | Status: DC | PRN
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
## 2022-10-26 NOTE — Assessment & Plan Note (Signed)
chronic  no PCP records in chart - old ER records, but no HTN meds other than Clonidine BP good today in office, pt checks at home and states she has had high readings, can feel when BP is high, gets flushed, HA will recheck BP at follow up, advised pt to continue to monitor and write down readings & time of day and bring with her to next visit advised on low sodium diet, drinking more water, exercise, less stress, smoking cessation continue to monitor

## 2022-10-26 NOTE — Assessment & Plan Note (Signed)
chronic - hx of 1/2-1ppd reports smoking only 6-8 cigs per day now, but also vaping through the day untreated COPD for long time, d/t no provider, self-admitted poor self-care advised on smoking cessation, generic chantix, pt will consider, husb also smokes & has no interest in quitting according to pt continue to monitor

## 2022-10-26 NOTE — Patient Instructions (Signed)
Welcome to [facility], It was a pleasure meeting you today!    As discussed, I have sent your refills for your inhalers and a med to help you sleep to your pharmacy.  I have sent a referral to our Pulmonology office, for your COPD, get a follow up CT scan and for your Sleep Apnea. They will call you directly.   Please schedule a physical with fasting labs in a month or sooner, and ok to do this with the other [place] office.     PLEASE NOTE: If you had any LAB tests please let us know if you have not heard back within a few days. You may see your results on MyChart before we have a chance to review them but we will give you a call once they are reviewed by [place]. If we ordered any REFERRALS today, please let us know if you have not heard from their office within the next week.  Let us know through MyChart if you are needing REFILLS, or have your pharmacy send [place] the request. You can also use MyChart to communicate with me or any office staff.

## 2022-10-26 NOTE — Assessment & Plan Note (Addendum)
chronic hx of Seroquel, Remeron, Hydroxyzine pt denies other meds or hx of failed meds also has untreated sleep apnea, sending referral to Pulm sending Trazodone 50mg  qhs as trial, pt most likely will need Psych referral d/t hx of depression f/u 1 month

## 2022-10-26 NOTE — Assessment & Plan Note (Signed)
chronic - hx of smoking 1/2-1ppd reports smoking only 6-8 cigs per day now, but also vaping through the day untreated COPD for long time, d/t no provider, self-admitted poor self-care hx of using Advair, Symbicort   sending Symbicort 160-4.5mg  bid with rescue Albuterol, advised pt on use & SE advised on smoking cessation referring to Pulmonology f/u 3 months

## 2022-10-27 NOTE — Assessment & Plan Note (Addendum)
chronic pt reports first dx about 75yrs ago but never pursued getting CPAP machine continues to smoke referring to Pulmonology

## 2022-11-02 ENCOUNTER — Ambulatory Visit: Payer: Medicaid Other | Admitting: Family

## 2022-11-23 ENCOUNTER — Ambulatory Visit (INDEPENDENT_AMBULATORY_CARE_PROVIDER_SITE_OTHER): Payer: Medicaid Other | Admitting: Pulmonary Disease

## 2022-11-23 ENCOUNTER — Encounter: Payer: Self-pay | Admitting: Pulmonary Disease

## 2022-11-23 ENCOUNTER — Other Ambulatory Visit: Payer: Self-pay | Admitting: Pulmonary Disease

## 2022-11-23 ENCOUNTER — Ambulatory Visit (INDEPENDENT_AMBULATORY_CARE_PROVIDER_SITE_OTHER): Payer: Medicaid Other

## 2022-11-23 VITALS — BP 138/88 | HR 80 | Ht 60.0 in | Wt 179.0 lb

## 2022-11-23 DIAGNOSIS — J439 Emphysema, unspecified: Secondary | ICD-10-CM | POA: Diagnosis not present

## 2022-11-23 DIAGNOSIS — R0602 Shortness of breath: Secondary | ICD-10-CM

## 2022-11-23 DIAGNOSIS — J449 Chronic obstructive pulmonary disease, unspecified: Secondary | ICD-10-CM | POA: Diagnosis not present

## 2022-11-23 DIAGNOSIS — J4489 Other specified chronic obstructive pulmonary disease: Secondary | ICD-10-CM

## 2022-11-23 MED ORDER — IPRATROPIUM-ALBUTEROL 0.5-2.5 (3) MG/3ML IN SOLN: 3.0000 mL | Freq: Four times a day (QID) | RESPIRATORY_TRACT | 1 refills | Status: DC | PRN

## 2022-11-23 MED ORDER — VARENICLINE TARTRATE 1 MG PO TABS: 1.0000 mg | ORAL_TABLET | Freq: Two times a day (BID) | ORAL | 2 refills | Status: DC

## 2022-11-23 MED ORDER — RAMELTEON 8 MG PO TABS: 8.0000 mg | ORAL_TABLET | Freq: Every day | ORAL | 2 refills | Status: AC

## 2022-11-23 MED ORDER — VARENICLINE TARTRATE 0.5 MG PO TABS: 0.5000 mg | ORAL_TABLET | Freq: Two times a day (BID) | ORAL | 0 refills | Status: DC

## 2022-11-23 MED ORDER — BUDESONIDE-FORMOTEROL FUMARATE 160-4.5 MCG/ACT IN AERO: 2.0000 | INHALATION_SPRAY | Freq: Two times a day (BID) | RESPIRATORY_TRACT | 3 refills | Status: DC

## 2022-11-23 NOTE — Patient Instructions (Signed)
Overnight oximetry to check whether you still need oxygen at night  Medication for your nebulizer  Continue with Symbicort  Chest x-ray today  Schedule for pulmonary function test  Schedule low-dose CT scan of the chest  Sleep study can be considered whenever you are able to get more sleep  Ramelteon for insomnia  Prescription for Chantix  Regular exercise as tolerated  You need to quit smoking before you burn off much more lung function  Follow-up in about 6 weeks  Call with significant concerns
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
## 2022-12-01 ENCOUNTER — Ambulatory Visit: Payer: Medicaid Other | Admitting: Nurse Practitioner

## 2022-12-01 ENCOUNTER — Encounter: Payer: Self-pay | Admitting: Nurse Practitioner

## 2022-12-01 VITALS — BP 132/78 | HR 64 | Ht 59.25 in | Wt 180.8 lb

## 2022-12-01 DIAGNOSIS — F4312 Post-traumatic stress disorder, chronic: Secondary | ICD-10-CM | POA: Diagnosis not present

## 2022-12-01 DIAGNOSIS — R1310 Dysphagia, unspecified: Secondary | ICD-10-CM | POA: Diagnosis not present

## 2022-12-01 DIAGNOSIS — R499 Unspecified voice and resonance disorder: Secondary | ICD-10-CM | POA: Diagnosis not present

## 2022-12-01 DIAGNOSIS — I10 Essential (primary) hypertension: Secondary | ICD-10-CM | POA: Diagnosis not present

## 2022-12-01 DIAGNOSIS — G8929 Other chronic pain: Secondary | ICD-10-CM

## 2022-12-01 DIAGNOSIS — R635 Abnormal weight gain: Secondary | ICD-10-CM | POA: Diagnosis not present

## 2022-12-01 DIAGNOSIS — F332 Major depressive disorder, recurrent severe without psychotic features: Secondary | ICD-10-CM | POA: Diagnosis not present

## 2022-12-01 DIAGNOSIS — R0989 Other specified symptoms and signs involving the circulatory and respiratory systems: Secondary | ICD-10-CM | POA: Diagnosis not present

## 2022-12-01 DIAGNOSIS — G4733 Obstructive sleep apnea (adult) (pediatric): Secondary | ICD-10-CM | POA: Diagnosis not present

## 2022-12-01 DIAGNOSIS — R5383 Other fatigue: Secondary | ICD-10-CM

## 2022-12-01 DIAGNOSIS — M5442 Lumbago with sciatica, left side: Secondary | ICD-10-CM

## 2022-12-01 DIAGNOSIS — F5104 Psychophysiologic insomnia: Secondary | ICD-10-CM

## 2022-12-01 DIAGNOSIS — J449 Chronic obstructive pulmonary disease, unspecified: Secondary | ICD-10-CM

## 2022-12-01 DIAGNOSIS — K5792 Diverticulitis of intestine, part unspecified, without perforation or abscess without bleeding: Secondary | ICD-10-CM | POA: Diagnosis not present

## 2022-12-01 DIAGNOSIS — M25562 Pain in left knee: Secondary | ICD-10-CM

## 2022-12-01 DIAGNOSIS — E559 Vitamin D deficiency, unspecified: Secondary | ICD-10-CM | POA: Diagnosis not present

## 2022-12-01 MED ORDER — QUETIAPINE FUMARATE 100 MG PO TABS
ORAL_TABLET | ORAL | 2 refills | Status: DC
Start: 2022-12-01 — End: 2023-01-27

## 2022-12-01 MED ORDER — CARIPRAZINE HCL 1.5 MG PO CAPS
1.5000 mg | ORAL_CAPSULE | ORAL | 0 refills | Status: DC
Start: 2022-12-01 — End: 2023-05-24

## 2022-12-01 MED ORDER — GABAPENTIN 100 MG PO CAPS
100.0000 mg | ORAL_CAPSULE | Freq: Three times a day (TID) | ORAL | 1 refills | Status: DC
Start: 2022-12-01 — End: 2023-01-27
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
## 2022-12-01 NOTE — Patient Instructions (Addendum)
I have sent in the Seroquel and gabapentin for you. This should help with your pain.   We will have you come back in about a month for your physical to go over your labs and see if we can find if there is something that we have found is causing the symptoms you are having.   I have sent the referral to orthopedic surgery.

## 2022-12-02 LAB — COMPREHENSIVE METABOLIC PANEL
ALT: 20 IU/L (ref 0–32)
AST: 17 IU/L (ref 0–40)
Albumin/Globulin Ratio: 1.8 (ref 1.2–2.2)
Albumin: 4.2 g/dL (ref 3.8–4.9)
Alkaline Phosphatase: 77 IU/L (ref 44–121)
BUN/Creatinine Ratio: 19 (ref 9–23)
BUN: 15 mg/dL (ref 6–24)
Bilirubin Total: <0.2 mg/dL (ref 0.0–1.2)
CO2: 27 mmol/L (ref 20–29)
Calcium: 10.1 mg/dL (ref 8.7–10.2)
Chloride: 102 mmol/L (ref 96–106)
Creatinine, Ser: 0.79 mg/dL (ref 0.57–1.00)
Globulin, Total: 2.4 g/dL (ref 1.5–4.5)
Glucose: 98 mg/dL (ref 70–99)
Potassium: 4.9 mmol/L (ref 3.5–5.2)
Sodium: 144 mmol/L (ref 134–144)
Total Protein: 6.6 g/dL (ref 6.0–8.5)
eGFR: 90 mL/min/{1.73_m2} (ref >59)

## 2022-12-02 LAB — IRON,TIBC AND FERRITIN PANEL
Ferritin: 95 ng/mL (ref 15–150)
Iron Saturation: 19 % (ref 15–55)
Iron: 57 ug/dL (ref 27–159)
Total Iron Binding Capacity: 302 ug/dL (ref 250–450)
UIBC: 245 ug/dL (ref 131–425)

## 2022-12-02 LAB — THYROID CASCADE PROFILE: TSH: 2.69 u[IU]/mL (ref 0.450–4.500)

## 2022-12-02 LAB — CBC WITH DIFFERENTIAL/PLATELET
Basophils Absolute: 0 10*3/uL (ref 0.0–0.2)
Basos: 0 %
EOS (ABSOLUTE): 0.2 10*3/uL (ref 0.0–0.4)
Eos: 4 %
Hematocrit: 40.3 % (ref 34.0–46.6)
Hemoglobin: 13.3 g/dL (ref 11.1–15.9)
Immature Grans (Abs): 0 10*3/uL (ref 0.0–0.1)
Immature Granulocytes: 0 %
Lymphocytes Absolute: 2.1 10*3/uL (ref 0.7–3.1)
Lymphs: 32 %
MCH: 29.5 pg (ref 26.6–33.0)
MCHC: 33 g/dL (ref 31.5–35.7)
MCV: 89 fL (ref 79–97)
Monocytes Absolute: 0.5 10*3/uL (ref 0.1–0.9)
Monocytes: 8 %
Neutrophils Absolute: 3.7 10*3/uL (ref 1.4–7.0)
Neutrophils: 56 %
Platelets: 282 10*3/uL (ref 150–450)
RBC: 4.51 x10E6/uL (ref 3.77–5.28)
RDW: 12.1 % (ref 11.7–15.4)
WBC: 6.6 10*3/uL (ref 3.4–10.8)

## 2022-12-02 LAB — LP+LDL DIRECT
Cholesterol, Total: 187 mg/dL (ref 100–199)
HDL: 61 mg/dL (ref >39)
LDL Chol Calc (NIH): 109 mg/dL — ABNORMAL HIGH (ref 0–99)
LDL Direct: 113 mg/dL — ABNORMAL HIGH (ref 0–99)
Triglycerides: 94 mg/dL (ref 0–149)
VLDL Cholesterol Cal: 17 mg/dL (ref 5–40)

## 2022-12-02 LAB — VITAMIN D 25 HYDROXY (VIT D DEFICIENCY, FRACTURES): Vit D, 25-Hydroxy: 12.8 ng/mL — ABNORMAL LOW (ref 30.0–100.0)

## 2022-12-02 LAB — HEMOGLOBIN A1C
Est. average glucose Bld gHb Est-mCnc: 120 mg/dL
Hgb A1c MFr Bld: 5.8 % — ABNORMAL HIGH (ref 4.8–5.6)

## 2022-12-07 ENCOUNTER — Other Ambulatory Visit (INDEPENDENT_AMBULATORY_CARE_PROVIDER_SITE_OTHER): Payer: Medicaid Other

## 2022-12-07 ENCOUNTER — Encounter: Payer: Self-pay | Admitting: Physician Assistant

## 2022-12-07 ENCOUNTER — Ambulatory Visit (INDEPENDENT_AMBULATORY_CARE_PROVIDER_SITE_OTHER): Payer: Medicaid Other | Admitting: Physician Assistant

## 2022-12-07 DIAGNOSIS — M25562 Pain in left knee: Secondary | ICD-10-CM

## 2022-12-07 DIAGNOSIS — G8929 Other chronic pain: Secondary | ICD-10-CM

## 2022-12-07 DIAGNOSIS — M545 Low back pain, unspecified: Secondary | ICD-10-CM | POA: Diagnosis not present

## 2022-12-07 MED ORDER — VITAMIN D3 1.25 MG (50000 UT) PO TABS
1.0000 | ORAL_TABLET | ORAL | 1 refills | Status: DC
Start: 2022-12-07 — End: 2023-06-08

## 2022-12-07 MED ORDER — MELOXICAM 15 MG PO TABS
15.0000 mg | ORAL_TABLET | Freq: Every day | ORAL | 0 refills | Status: DC
Start: 2022-12-07 — End: 2022-12-29

## 2022-12-07 MED ORDER — METHYLPREDNISOLONE ACETATE 40 MG/ML IJ SUSP
40.0000 mg | INTRAMUSCULAR | Status: AC | PRN
Start: 2022-12-07 — End: 2022-12-07
  Administered 2022-12-07: 40 mg via INTRA_ARTICULAR

## 2022-12-07 MED ORDER — LIDOCAINE HCL 1 % IJ SOLN
2.0000 mL | INTRAMUSCULAR | Status: AC | PRN
Start: 2022-12-07 — End: 2022-12-07
  Administered 2022-12-07: 2 mL

## 2022-12-07 MED ORDER — BUPIVACAINE HCL 0.25 % IJ SOLN
2.0000 mL | INTRAMUSCULAR | Status: AC | PRN
Start: 2022-12-07 — End: 2022-12-07
  Administered 2022-12-07: 2 mL via INTRA_ARTICULAR
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
## 2022-12-10 NOTE — Assessment & Plan Note (Signed)
Nancy Decker endorses difficulty sleeping and mood swings for which she has previously been prescribed Seroquel 300 mg at bedtime.  I will provide refills on this today for ongoing management.

## 2022-12-10 NOTE — Assessment & Plan Note (Signed)
History of OSA.  Unclear if she is currently on CPAP.  Given the additional history of COPD and recent weight gain, close monitoring is recommended.

## 2022-12-10 NOTE — Assessment & Plan Note (Signed)
Chronic HTN. Currently on no medications. At this time systolic BP is slightly above goal. I recommend at home monitoring with goal BP less than 130/80 on regular basis.  Plan: - Labs today - Monitor your blood pressures at home.  If your home blood pressures are showing more than 130/80 for more than 2 readings in a row please let me know immediately as we would need to consider starting medication for improved management to help protect you against complications.

## 2022-12-10 NOTE — Assessment & Plan Note (Signed)
Nancy Decker is experiencing symptoms of anxiety and depression as well as mood swings.  She has been on Seroquel 300 mg. Her mood symptoms are consistent with fluctuations from staying awake for days at a time with impulsive behaviors. We discussed the treatment option with Vraylar and she is interested in trying this.  Plan - samples of vraylar provided - will monitor response to medication and make changes as needed.  - consider counseling for additional support.

## 2022-12-10 NOTE — Assessment & Plan Note (Signed)
Nancy Decker has a history of COPD currently managed with albuterol and Symbicort.  She does have DuoNeb available as well.  At this time there are no alarm symptoms present however recommend close monitoring due to smoking history. Plan: -Work to quit smoking with Chantix -Please let me know if your breathing becomes labored or difficult. -Labs pending.

## 2022-12-10 NOTE — Assessment & Plan Note (Signed)
Chronic low back pain with history of T12 fracture.  Plan: - lumbar spine x-ray ordered - will send referral to ortho/spine specialist as needed

## 2022-12-11 ENCOUNTER — Ambulatory Visit (HOSPITAL_COMMUNITY)
Admission: RE | Admit: 2022-12-11 | Discharge: 2022-12-11 | Disposition: A | Discharge status: Home / Self Care | Payer: Medicaid Other | Source: Ambulatory Visit | Attending: Family | Admitting: Family

## 2022-12-11 DIAGNOSIS — J4489 Other specified chronic obstructive pulmonary disease: Secondary | ICD-10-CM | POA: Insufficient documentation

## 2022-12-11 DIAGNOSIS — J439 Emphysema, unspecified: Secondary | ICD-10-CM | POA: Insufficient documentation

## 2022-12-11 DIAGNOSIS — Z87891 Personal history of nicotine dependence: Secondary | ICD-10-CM | POA: Diagnosis not present

## 2022-12-27 ENCOUNTER — Other Ambulatory Visit: Payer: Self-pay | Admitting: Physician Assistant

## 2022-12-27 ENCOUNTER — Other Ambulatory Visit: Payer: Self-pay | Admitting: Pulmonary Disease

## 2023-01-11 ENCOUNTER — Other Ambulatory Visit: Payer: Medicaid Other

## 2023-01-25 ENCOUNTER — Ambulatory Visit: Payer: 59 | Admitting: Nurse Practitioner

## 2023-01-27 ENCOUNTER — Encounter: Payer: Self-pay | Admitting: Nurse Practitioner

## 2023-01-27 ENCOUNTER — Ambulatory Visit (INDEPENDENT_AMBULATORY_CARE_PROVIDER_SITE_OTHER): Payer: 59 | Admitting: Nurse Practitioner

## 2023-01-27 VITALS — BP 134/82 | HR 92 | Wt 183.4 lb

## 2023-01-27 DIAGNOSIS — F5104 Psychophysiologic insomnia: Secondary | ICD-10-CM | POA: Diagnosis not present

## 2023-01-27 DIAGNOSIS — R7303 Prediabetes: Secondary | ICD-10-CM

## 2023-01-27 DIAGNOSIS — G8929 Other chronic pain: Secondary | ICD-10-CM | POA: Diagnosis not present

## 2023-01-27 DIAGNOSIS — Z6837 Body mass index (BMI) 37.0-37.9, adult: Secondary | ICD-10-CM | POA: Insufficient documentation

## 2023-01-27 DIAGNOSIS — S81801A Unspecified open wound, right lower leg, initial encounter: Secondary | ICD-10-CM | POA: Diagnosis not present

## 2023-01-27 DIAGNOSIS — M5442 Lumbago with sciatica, left side: Secondary | ICD-10-CM

## 2023-01-27 DIAGNOSIS — F332 Major depressive disorder, recurrent severe without psychotic features: Secondary | ICD-10-CM | POA: Diagnosis not present

## 2023-01-27 DIAGNOSIS — Z6836 Body mass index (BMI) 36.0-36.9, adult: Secondary | ICD-10-CM | POA: Diagnosis not present

## 2023-01-27 DIAGNOSIS — E782 Mixed hyperlipidemia: Secondary | ICD-10-CM | POA: Diagnosis not present

## 2023-01-27 DIAGNOSIS — F4312 Post-traumatic stress disorder, chronic: Secondary | ICD-10-CM

## 2023-01-27 DIAGNOSIS — E66812 Obesity, class 2: Secondary | ICD-10-CM | POA: Insufficient documentation

## 2023-01-27 HISTORY — DX: Unspecified open wound, right lower leg, initial encounter: S81.801A

## 2023-01-27 MED ORDER — METFORMIN HCL ER 500 MG PO TB24
ORAL_TABLET | ORAL | 3 refills | Status: DC
Start: 2023-01-27 — End: 2023-04-12

## 2023-01-27 MED ORDER — QUETIAPINE FUMARATE 200 MG PO TABS
ORAL_TABLET | ORAL | 1 refills | Status: DC
Start: 2023-01-27 — End: 2023-04-06

## 2023-01-27 MED ORDER — AMLODIPINE-ATORVASTATIN 2.5-20 MG PO TABS
1.0000 | ORAL_TABLET | Freq: Every day | ORAL | 3 refills | Status: DC
Start: 2023-01-27 — End: 2023-05-24

## 2023-01-27 MED ORDER — GABAPENTIN 300 MG PO CAPS
ORAL_CAPSULE | ORAL | 1 refills | Status: DC
Start: 2023-01-27 — End: 2023-05-24

## 2023-01-27 MED ORDER — MUPIROCIN 2 % EX OINT
TOPICAL_OINTMENT | CUTANEOUS | 2 refills | Status: AC
Start: 2023-01-27 — End: ?
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
## 2023-01-27 NOTE — Patient Instructions (Addendum)
I have sent in two different medications for you to start for your blood sugar and weight. I have also included medication that can help with your cholesterol and your blood pressures. This is recommended for everyone that has blood sugar problems to protect your heart and blood vessels.   Increase your gabapentin to 300mg  in the morning and 600mg  at bedtime.   I will send in a prescription for a machine for your blood sugar and your blood pressure.    WEIGHT LOSS PLANNING and PREDIABETES Your progress today shows:     01/27/2023    9:41 AM 12/01/2022    2:37 PM 11/23/2022    1:53 PM  Vitals with BMI  Height  4' 11.25" 5\' 0"   Weight 183 lbs 6 oz 180 lbs 13 oz 179 lbs  BMI  36.21 34.96  Systolic 134 132 161  Diastolic 82 78 88  Pulse 92 64 80    For best management of weight, it is vital to balance intake versus output. This means the number of calories burned per day must be less than the calories you take in with food and drink.   I recommend trying to follow a diet with the following: Calories: 1200-1500 calories per day Carbohydrates: 150-180 grams of carbohydrates per day  Why: Gives your body enough "quick fuel" for cells to maintain normal function without sending them into starvation mode.  Protein: At least 90 grams of protein per day- 30 grams with each meal Why: Protein takes longer and uses more energy than carbohydrates to break down for fuel. The carbohydrates in your meals serves as quick energy sources and proteins help use some of that extra quick energy to break down to produce long term energy. This helps you not feel hungry as quickly and protein breakdown burns calories.  Water: Drink AT LEAST 64 ounces of water per day  Why: Water is essential to healthy metabolism. Water helps to fill the stomach and keep you fuller longer. Water is required for healthy digestion and filtering of waste in the body.  Fat: Limit fats in your diet- when choosing fats, choose foods  with lower fats content such as lean meats (chicken, fish, [place]).  Why: Increased fat intake leads to storage "for later". Once you burn your carbohydrate energy, your body goes into fat and protein breakdown mode to help you loose weight.  Cholesterol: Fats and oils that are LIQUID at room temperature are best. Choose vegetable oils (olive oil, avocado oil, nuts). Avoid fats that are SOLID at room temperature (animal fats, processed meats). Healthy fats are often found in whole grains, beans, nuts, seeds, and berries.  Why: Elevated cholesterol levels lead to build up of cholesterol on the inside of your blood vessels. This will eventually cause the blood vessels to become hard and can lead to high blood pressure and damage to your organs. When the blood flow is reduced, but the pressure is high from cholesterol buildup, parts of the cholesterol can break off and form clots that can go to the brain or heart leading to a stroke or heart attack.  Fiber: Increase amount of SOLUBLE the fiber in your diet. This helps to fill you up, lowers cholesterol, and helps with digestion. Some foods high in soluble fiber are oats, peas, beans, apples, carrots, barley, and citrus fruits.   Why: Fiber fills you up, helps remove excess cholesterol, and aids in healthy digestion which are all very important in weight management.   I  recommend the following as a minimum activity routine: Purposeful walk or other physical activity at least 20 minutes every single day. This means purposefully taking a walk, jog, bike, swim, treadmill, elliptical, dance, etc.  This activity should be ABOVE your normal daily activities, such as walking at work. Goal exercise should be at least 150 minutes a week- work your way up to this.   Heart Rate: Your maximum exercise heart rate should be 220 - Your Age in Years. When exercising, get your heart rate up, but avoid going over the maximum targeted heart rate.  60-70% of your maximum  heart rate is where you tend to burn the most fat. To find this number:  220 - Age In Years= Max HR  Max HR x 0.6 (or 0.7) = Fat Burning HR The Fat Burning HR is your goal heart rate while working out to burn the most fat.  NEVER exercise to the point your feel lightheaded, weak, nauseated, dizzy. If you experience ANY of these symptoms- STOP exercise! Allow yourself to cool down and your heart rate to come down. Then restart slower next time.  If at ANY TIME you feel chest pain or chest pressure during exercise, STOP IMMEDIATELY and seek medical attention.    Diabetes I recommend that you check your blood sugar at least once every morning before eating and write down this number to bring with you to your follow-up visits so we can go over them.  If your blood sugar is higher than 150 fasting for three readings, please let me know so we can adjust your medications.  It is always a good idea to also check your blood pressure in the mornings, as well, as diabetes and high blood pressure can go hand in hand.  Your goal blood pressure is less than 130/80.  If your blood pressure is higher than your goal for three readings in a row, please let me know so we can adjust your medications.  I recommend daily physical activity of at least 20 minutes to help improve your heart health and help your body use the excess sugar. I recommend no more than 1200-1500 calories per day with no more than 150 grams of carbohydrates.

## 2023-01-28 ENCOUNTER — Ambulatory Visit: Payer: 59 | Admitting: Nurse Practitioner

## 2023-02-05 ENCOUNTER — Encounter: Payer: Self-pay | Admitting: Pulmonary Disease

## 2023-02-05 ENCOUNTER — Ambulatory Visit (INDEPENDENT_AMBULATORY_CARE_PROVIDER_SITE_OTHER): Payer: 59 | Admitting: Pulmonary Disease

## 2023-02-05 ENCOUNTER — Ambulatory Visit (INDEPENDENT_AMBULATORY_CARE_PROVIDER_SITE_OTHER): Payer: MEDICAID | Admitting: Pulmonary Disease

## 2023-02-05 VITALS — BP 122/78 | HR 97 | Ht 59.0 in | Wt 180.0 lb

## 2023-02-05 DIAGNOSIS — J4489 Other specified chronic obstructive pulmonary disease: Secondary | ICD-10-CM | POA: Diagnosis not present

## 2023-02-05 DIAGNOSIS — J449 Chronic obstructive pulmonary disease, unspecified: Secondary | ICD-10-CM | POA: Diagnosis not present

## 2023-02-05 DIAGNOSIS — J439 Emphysema, unspecified: Secondary | ICD-10-CM | POA: Diagnosis not present

## 2023-02-05 DIAGNOSIS — R0602 Shortness of breath: Secondary | ICD-10-CM

## 2023-02-05 LAB — PULMONARY FUNCTION TEST
DL/VA % pred: 89 %
DL/VA: 4.35 ml/min/mmHg/L
DLCO cor % pred: 121 %
DLCO cor: 14.2 ml/min/mmHg
DLCO unc % pred: 121 %
DLCO unc: 14.2 ml/min/mmHg
FEF 25-75 Post: 1.1 L/sec
FEF 25-75 Pre: 1.35 L/sec
FEF2575-%Change-Post: -18 %
FEF2575-%Pred-Post: 57 %
FEF2575-%Pred-Pre: 70 %
FEV1-%Change-Post: -4 %
FEV1-%Pred-Post: 109 %
FEV1-%Pred-Pre: 114 %
FEV1-Post: 1.64 L
FEV1-Pre: 1.72 L
FEV1FVC-%Change-Post: 1 %
FEV1FVC-%Pred-Pre: 95 %
FEV6-%Change-Post: -4 %
FEV6-%Pred-Post: 116 %
FEV6-%Pred-Pre: 122 %
FEV6-Post: 2.13 L
FEV6-Pre: 2.24 L
FEV6FVC-%Change-Post: 0 %
FEV6FVC-%Pred-Post: 102 %
FEV6FVC-%Pred-Pre: 101 %
FVC-%Change-Post: -5 %
FVC-%Pred-Post: 113 %
FVC-%Pred-Pre: 120 %
FVC-Post: 2.14 L
FVC-Pre: 2.26 L
Post FEV1/FVC ratio: 77 %
Post FEV6/FVC ratio: 100 %
Pre FEV1/FVC ratio: 76 %
Pre FEV6/FVC Ratio: 99 %
RV % pred: 224 %
RV: 2.42 L
TLC % pred: 165 %
TLC: 4.64 L

## 2023-02-05 MED ORDER — TRELEGY ELLIPTA 100-62.5-25 MCG/ACT IN AEPB
1.0000 | INHALATION_SPRAY | Freq: Every day | RESPIRATORY_TRACT | 3 refills | Status: DC
Start: 2023-02-05 — End: 2023-05-24
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
## 2023-02-05 NOTE — Patient Instructions (Signed)
PFT done today. 

## 2023-02-05 NOTE — Patient Instructions (Signed)
Will replace Symbicort with Trelegy 100  Your breathing study does look good Your CAT scan did reveal areas of emphysema  Continue to stay off cigarettes  Graded activities as tolerated  I will see you back in about 3 months  Call with significant concerns

## 2023-02-05 NOTE — Progress Notes (Signed)
PFT done today. 

## 2023-02-09 ENCOUNTER — Other Ambulatory Visit: Payer: Self-pay | Admitting: Family

## 2023-02-09 DIAGNOSIS — G4700 Insomnia, unspecified: Secondary | ICD-10-CM

## 2023-02-18 ENCOUNTER — Other Ambulatory Visit: Payer: Self-pay | Admitting: Physician Assistant

## 2023-02-18 ENCOUNTER — Other Ambulatory Visit: Payer: Self-pay | Admitting: Nurse Practitioner

## 2023-02-18 DIAGNOSIS — F332 Major depressive disorder, recurrent severe without psychotic features: Secondary | ICD-10-CM

## 2023-02-18 DIAGNOSIS — F4312 Post-traumatic stress disorder, chronic: Secondary | ICD-10-CM

## 2023-02-18 DIAGNOSIS — F5104 Psychophysiologic insomnia: Secondary | ICD-10-CM

## 2023-03-03 NOTE — Assessment & Plan Note (Signed)
Refills on seroquel

## 2023-03-03 NOTE — Assessment & Plan Note (Signed)
Starting metformin for management and blood sugar monitoring.
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
## 2023-03-03 NOTE — Assessment & Plan Note (Signed)
>>  ASSESSMENT AND PLAN FOR BMI 38.0-38.9,ADULT WRITTEN ON 03/03/2023 10:04 PM BY Jehan Bonano E, NP  Concerns with weight management are present. We discussed insulin resistance and the impact this has on weight management. I feel that this, coupled with her inability to exercise routinely due to chronic pain, are contributing to her weight concerns. We discussed starting metformin to see if this is helpful for her blood sugars and weight management.  Plan: - Start metformin for blood sugar and weight - monitor your blood sugars routinely, I have sent in an order for a monitor. Your blood sugar goals are 80-110 when waking up and less than 150 2 hours after eating.  - Eat regular meals and avoid skipping. Smaller meals with more frequent meals are better for blood sugar and weight than skipping.  - Be sure your meals are low in carbohydrates (less than 180 grams total per day) and high in protein (aim for at least 80 grams of protein a day). Add fiber to your diet, this helps fill you up and controls your blood sugar and cravings - Increase your water intake. Avoid any drinks with sugar and aim for at least 80 ounces of water a day.

## 2023-03-03 NOTE — Assessment & Plan Note (Signed)
Concerns with weight management are present. We discussed insulin resistance and the impact this has on weight management. I feel that this, coupled with her inability to exercise routinely due to chronic pain, are contributing to her weight concerns. We discussed starting metformin to see if this is helpful for her blood sugars and weight management.  Plan: - Start metformin for blood sugar and weight - monitor your blood sugars routinely, I have sent in an order for a monitor. Your blood sugar goals are 80-110 when waking up and less than 150 2 hours after eating.  - Eat regular meals and avoid skipping. Smaller meals with more frequent meals are better for blood sugar and weight than skipping.  - Be sure your meals are low in carbohydrates (less than 180 grams total per day) and high in protein (aim for at least 80 grams of protein a day). Add fiber to your diet, this helps fill you up and controls your blood sugar and cravings - Increase your water intake. Avoid any drinks with sugar and aim for at least 80 ounces of water a day.

## 2023-03-03 NOTE — Assessment & Plan Note (Addendum)
The patient has a leg wound that is healing slowly. There are no signs of infection present today. Reassurance provided. Tetanus immunization is up-to-date. Plan: - Apply mupirocin ointment topically on the wound. - Continue current wound care regimen, including the use of gauze and antibiotic wipes. - Monitor the progress of wound healing and consider alternative treatments if no improvement is observed.

## 2023-03-13 ENCOUNTER — Encounter: Payer: Self-pay | Admitting: Pulmonary Disease

## 2023-03-13 ENCOUNTER — Encounter: Payer: Self-pay | Admitting: Nurse Practitioner

## 2023-03-13 DIAGNOSIS — J439 Emphysema, unspecified: Secondary | ICD-10-CM

## 2023-03-13 DIAGNOSIS — R0602 Shortness of breath: Secondary | ICD-10-CM

## 2023-03-15 MED ORDER — NEBULIZER/TUBING/MOUTHPIECE KIT
1.0000 | PACK | 0 refills | Status: AC | PRN
Start: 2023-03-15 — End: 2023-04-14

## 2023-03-18 ENCOUNTER — Ambulatory Visit: Payer: 59 | Admitting: Physician Assistant

## 2023-03-22 DIAGNOSIS — F99 Mental disorder, not otherwise specified: Secondary | ICD-10-CM | POA: Diagnosis not present

## 2023-04-01 ENCOUNTER — Telehealth: Payer: Self-pay

## 2023-04-01 NOTE — Telephone Encounter (Signed)
Faxed request for 90 day supply of quetiapine fumarate 100 MG tabs. Last appt 01/27/23 for acute. New pt exam was 12/01/22.

## 2023-04-06 ENCOUNTER — Telehealth: Payer: Self-pay | Admitting: Nurse Practitioner

## 2023-04-06 DIAGNOSIS — F4312 Post-traumatic stress disorder, chronic: Secondary | ICD-10-CM

## 2023-04-06 DIAGNOSIS — F332 Major depressive disorder, recurrent severe without psychotic features: Secondary | ICD-10-CM

## 2023-04-06 DIAGNOSIS — F5104 Psychophysiologic insomnia: Secondary | ICD-10-CM

## 2023-04-06 MED ORDER — QUETIAPINE FUMARATE 200 MG PO TABS: ORAL_TABLET | ORAL | 1 refills | Status: DC

## 2023-04-06 NOTE — Telephone Encounter (Signed)
Fax refill request from [facility]  [address]   Quetiapine Fumarate 100 mg #180

## 2023-04-06 NOTE — Telephone Encounter (Signed)
Ok to refill seroquel  

## 2023-04-11 ENCOUNTER — Other Ambulatory Visit: Payer: Self-pay | Admitting: Nurse Practitioner

## 2023-04-11 DIAGNOSIS — Z6836 Body mass index (BMI) 36.0-36.9, adult: Secondary | ICD-10-CM

## 2023-04-11 DIAGNOSIS — E782 Mixed hyperlipidemia: Secondary | ICD-10-CM

## 2023-04-11 DIAGNOSIS — R7303 Prediabetes: Secondary | ICD-10-CM

## 2023-04-21 ENCOUNTER — Ambulatory Visit: Payer: 59 | Admitting: Physician Assistant

## 2023-04-23 ENCOUNTER — Ambulatory Visit: Payer: 59 | Admitting: Physician Assistant

## 2023-04-26 ENCOUNTER — Other Ambulatory Visit: Payer: Self-pay | Admitting: Nurse Practitioner

## 2023-04-26 DIAGNOSIS — Z1231 Encounter for screening mammogram for malignant neoplasm of breast: Secondary | ICD-10-CM

## 2023-04-27 ENCOUNTER — Ambulatory Visit: Payer: 59 | Admitting: Physician Assistant

## 2023-05-05 ENCOUNTER — Other Ambulatory Visit: Payer: Self-pay | Admitting: Pulmonary Disease

## 2023-05-12 ENCOUNTER — Encounter: Payer: 59 | Admitting: Medical

## 2023-05-13 ENCOUNTER — Other Ambulatory Visit: Payer: Self-pay | Admitting: Nurse Practitioner

## 2023-05-13 ENCOUNTER — Ambulatory Visit
Admission: RE | Admit: 2023-05-13 | Discharge: 2023-05-13 | Disposition: A | Discharge status: Home / Self Care | Payer: 59 | Source: Ambulatory Visit

## 2023-05-13 DIAGNOSIS — Z1231 Encounter for screening mammogram for malignant neoplasm of breast: Secondary | ICD-10-CM

## 2023-05-13 NOTE — Progress Notes (Signed)
error 

## 2023-05-24 ENCOUNTER — Encounter: Payer: Self-pay | Admitting: Nurse Practitioner

## 2023-05-24 ENCOUNTER — Ambulatory Visit (INDEPENDENT_AMBULATORY_CARE_PROVIDER_SITE_OTHER): Payer: 59 | Admitting: Nurse Practitioner

## 2023-05-24 VITALS — BP 142/84 | Wt 185.4 lb

## 2023-05-24 DIAGNOSIS — Z6837 Body mass index (BMI) 37.0-37.9, adult: Secondary | ICD-10-CM | POA: Diagnosis not present

## 2023-05-24 DIAGNOSIS — M545 Low back pain, unspecified: Secondary | ICD-10-CM

## 2023-05-24 DIAGNOSIS — R7303 Prediabetes: Secondary | ICD-10-CM | POA: Diagnosis not present

## 2023-05-24 DIAGNOSIS — G8929 Other chronic pain: Secondary | ICD-10-CM | POA: Diagnosis not present

## 2023-05-24 DIAGNOSIS — Z23 Encounter for immunization: Secondary | ICD-10-CM | POA: Diagnosis not present

## 2023-05-24 DIAGNOSIS — F332 Major depressive disorder, recurrent severe without psychotic features: Secondary | ICD-10-CM

## 2023-05-24 DIAGNOSIS — I10 Essential (primary) hypertension: Secondary | ICD-10-CM

## 2023-05-24 DIAGNOSIS — E782 Mixed hyperlipidemia: Secondary | ICD-10-CM | POA: Diagnosis not present

## 2023-05-24 DIAGNOSIS — F3162 Bipolar disorder, current episode mixed, moderate: Secondary | ICD-10-CM | POA: Diagnosis not present

## 2023-05-24 MED ORDER — ATORVASTATIN CALCIUM 20 MG PO TABS
20.0000 mg | ORAL_TABLET | Freq: Every day | ORAL | 1 refills | Status: AC
Start: 2023-05-24 — End: ?

## 2023-05-24 MED ORDER — AMLODIPINE BESYLATE 5 MG PO TABS
5.0000 mg | ORAL_TABLET | Freq: Every day | ORAL | 1 refills | Status: DC
Start: 2023-05-24 — End: 2023-08-31

## 2023-05-24 NOTE — Assessment & Plan Note (Signed)
See Bipolar disorder

## 2023-05-24 NOTE — Assessment & Plan Note (Signed)
>>  ASSESSMENT AND PLAN FOR BMI 38.0-38.9,ADULT WRITTEN ON 05/24/2023 12:42 PM BY Sandip Power E, NP  Ongoing concerns with increased weight despite changes in diet and activity and medication with metformin. It appears that her insurance does not cover weight management medications as they are listed as "not on formulary". Phentermine containing options are not an option with bipolar disorder not controlled and elevated blood pressures. We briefly discussed the option of weight management, which she initially stated she would be interested in, but then became agitated when I asked if she would like a referral. I did express to her that the newer medications are very expensive and it does not appear her insurance covers these. She does not have diabetes to qualify for GLP-1 medications from that route. Unfortunately, she became upset when I told her the medications were expensive and left the room without further discussion or recommendations. I will be happy to place a referral for weight management if she would like to see them and discuss options that they may recommend.

## 2023-05-24 NOTE — Assessment & Plan Note (Signed)
Concerns with bilateral lumbar pain expressed. She is seeing orthopedics for this, which I feel is appropriate. It seems that they are actively working to help find solutions for her. At this time she appears to be in a manic state, which may be exacerbating her pain. I recommend continuing to work with orthopedics for this concern.

## 2023-05-24 NOTE — Assessment & Plan Note (Signed)
History of bipolar disorder not currently on routine medications for management. Her speech and behaviors today were consistent with mania with a wide range of emotional changes, frustration, rapid/pressured and slurred speech, flight of ideas/difficulty keeping a thought process, and inability to sit still. She abruptly stood and walked out of the exam room in the middle of the visit, expressing frustration over our discussion with weight management. She is in agreement to psychiatric management, which I feel is most appropriate at this time. I attempted to get her on Vraylar in April of this year, but it appears that she was either unable to get coverage for this or decided not to continue with the medication. Given her current state, I will request an urgent referral so she can hopefully get an evaluation sooner rather than later.

## 2023-05-24 NOTE — Progress Notes (Signed)
Shawna Clamp, DNP, AGNP-c [facility]  [address] [place], [place] [address] [phone]  ESTABLISHED PATIENT- Chronic Health and/or Follow-Up Visit  Blood pressure (!) 142/84, weight 185 lb 6.4 oz (84.1 kg).    Nancy Decker is a 52 y.o. year old female presenting today for evaluation and management of chronic conditions.  History of Present Illness The patient, with a history of bipolar disorder, presents with concerns about weight gain and depression. She reports a significant increase in weight to 185 pounds, which she finds distressing and believes is contributing to her depressive symptoms. Despite efforts to manage her weight through regular walking, increased water intake, and small meals, she has not seen any improvement. We additionally started metformin for pre-diabetes, but she has not had any success with that, either. She mentions researching medications, but I was unable to determine what medications she was referring to.   The patient also reports experiencing significant anxiety, which is affecting her sleep. She describes an inability to sleep and frequently nodding off in a chair, but waking and unable to fall back asleep after a short period of time. She expresses feeling scatterbrained and unable to focus, likening her symptoms to those of ADHD. I was unable to determine a time frame for this.  The patient has a history of seeing a psychologist and has previously been diagnosed with bipolar disorder. She expresses a desire to manage her mental health. She has been prescribed Vraylar (cariprazine) for mood management in the past, but it is unclear if she has been able to obtain this medication from her insurance company.   In addition to her mental health concerns, the patient is experiencing physical discomfort. She reports pain in her lower back, specifically in the sacrum area, which she believes may be due to her weight gain. She has seen an  orthopedic doctor for this issue and has been prescribed meloxicam. She reports that she has been recommended to be seen by pain management, but due to strong family history of addictive disorders she is hesitant to take that step.  All ROS negative with exception of what is listed above.   PHYSICAL EXAM Physical Exam Vitals reviewed.  Neurological:     Mental Status: She is alert.  Psychiatric:        Attention and Perception: She is inattentive.        Mood and Affect: Mood is anxious and elated. Affect is labile and tearful.        Speech: Speech is rapid and pressured and slurred.        Behavior: Behavior is agitated and hyperactive.        Judgment: Judgment is impulsive and inappropriate.      PLAN Problem List Items Addressed This Visit     Major depressive disorder, recurrent episode, severe (HCC)    See Bipolar disorder.       Primary hypertension   Relevant Medications   amLODipine (NORVASC) 5 MG tablet   atorvastatin (LIPITOR) 20 MG tablet   Low back pain    Concerns with bilateral lumbar pain expressed. She is seeing orthopedics for this, which I feel is appropriate. It seems that they are actively working to help find solutions for her. At this time she appears to be in a manic state, which may be exacerbating her pain. I recommend continuing to work with orthopedics for this concern.       BMI 37.0-37.9, adult    Ongoing concerns with increased weight despite  changes in diet and activity and medication with metformin. It appears that her insurance does not cover weight management medications as they are listed as "not on formulary". Phentermine containing options are not an option with bipolar disorder not controlled and elevated blood pressures. We briefly discussed the option of weight management, which she initially stated she would be interested in, but then became agitated when I asked if she would like a referral. I did express to her that the newer medications  are very expensive and it does not appear her insurance covers these. She does not have diabetes to qualify for GLP-1 medications from that route. Unfortunately, she became upset when I told her the medications were expensive and left the room without further discussion or recommendations. I will be happy to place a referral for weight management if she would like to see them and discuss options that they may recommend.       Bipolar disorder, current episode mixed, moderate (HCC) - Primary    History of bipolar disorder not currently on routine medications for management. Her speech and behaviors today were consistent with mania with a wide range of emotional changes, frustration, rapid/pressured and slurred speech, flight of ideas/difficulty keeping a thought process, and inability to sit still. She abruptly stood and walked out of the exam room in the middle of the visit, expressing frustration over our discussion with weight management. She is in agreement to psychiatric management, which I feel is most appropriate at this time. I attempted to get her on Vraylar in April of this year, but it appears that she was either unable to get coverage for this or decided not to continue with the medication. Given her current state, I will request an urgent referral so she can hopefully get an evaluation sooner rather than later.       Relevant Orders   Ambulatory referral to Psychiatry   Pre-diabetes   Mixed hyperlipidemia   Relevant Medications   amLODipine (NORVASC) 5 MG tablet   atorvastatin (LIPITOR) 20 MG tablet   Other Visit Diagnoses     Need for influenza vaccination       Relevant Orders   Flu vaccine trivalent PF, 6mos and older(Flulaval,Afluria,Fluarix,Fluzone) (Completed)       Return if symptoms worsen or fail to improve.  Shawna Clamp, DNP, AGNP-c

## 2023-05-24 NOTE — Assessment & Plan Note (Signed)
Ongoing concerns with increased weight despite changes in diet and activity and medication with metformin. It appears that her insurance does not cover weight management medications as they are listed as "not on formulary". Phentermine containing options are not an option with bipolar disorder not controlled and elevated blood pressures. We briefly discussed the option of weight management, which she initially stated she would be interested in, but then became agitated when I asked if she would like a referral. I did express to her that the newer medications are very expensive and it does not appear her insurance covers these. She does not have diabetes to qualify for GLP-1 medications from that route. Unfortunately, she became upset when I told her the medications were expensive and left the room without further discussion or recommendations. I will be happy to place a referral for weight management if she would like to see them and discuss options that they may recommend.

## 2023-05-25 ENCOUNTER — Ambulatory Visit: Payer: 59 | Admitting: Nurse Practitioner

## 2023-05-26 ENCOUNTER — Ambulatory Visit: Payer: 59 | Admitting: Physician Assistant

## 2023-06-07 ENCOUNTER — Other Ambulatory Visit: Payer: 59

## 2023-06-08 ENCOUNTER — Other Ambulatory Visit: Payer: Self-pay | Admitting: Nurse Practitioner

## 2023-06-08 DIAGNOSIS — E559 Vitamin D deficiency, unspecified: Secondary | ICD-10-CM

## 2023-07-16 ENCOUNTER — Encounter (HOSPITAL_COMMUNITY): Payer: Self-pay

## 2023-07-20 ENCOUNTER — Ambulatory Visit (HOSPITAL_COMMUNITY): Payer: 59 | Admitting: Physician Assistant

## 2023-07-22 ENCOUNTER — Telehealth: Payer: 59 | Admitting: Physician Assistant

## 2023-07-22 ENCOUNTER — Other Ambulatory Visit: Payer: Self-pay | Admitting: Family

## 2023-07-22 ENCOUNTER — Encounter: Payer: Self-pay | Admitting: Pulmonary Disease

## 2023-07-22 ENCOUNTER — Other Ambulatory Visit: Payer: Self-pay | Admitting: Nurse Practitioner

## 2023-07-22 ENCOUNTER — Encounter: Payer: Self-pay | Admitting: Nurse Practitioner

## 2023-07-22 DIAGNOSIS — E559 Vitamin D deficiency, unspecified: Secondary | ICD-10-CM

## 2023-07-22 DIAGNOSIS — G8929 Other chronic pain: Secondary | ICD-10-CM

## 2023-07-22 DIAGNOSIS — F332 Major depressive disorder, recurrent severe without psychotic features: Secondary | ICD-10-CM

## 2023-07-22 DIAGNOSIS — J449 Chronic obstructive pulmonary disease, unspecified: Secondary | ICD-10-CM

## 2023-07-22 NOTE — Progress Notes (Signed)
Patient no-showed today's appointment; appointment was for 0700. I waited until 0710. Patient marked no show.

## 2023-07-23 ENCOUNTER — Other Ambulatory Visit: Payer: Self-pay

## 2023-07-23 MED ORDER — LANCETS MISC. MISC: 1.0000 | Freq: Three times a day (TID) | 0 refills | Status: AC

## 2023-07-23 MED ORDER — BLOOD GLUCOSE MONITORING SUPPL DEVI: 1.0000 | Freq: Three times a day (TID) | 0 refills | Status: DC

## 2023-07-23 MED ORDER — LANCET DEVICE MISC: 1.0000 | Freq: Three times a day (TID) | 0 refills | Status: AC

## 2023-07-23 MED ORDER — BLOOD GLUCOSE TEST VI STRP
1.0000 | ORAL_STRIP | Freq: Three times a day (TID) | 0 refills | Status: DC
Start: 2023-07-23 — End: 2023-08-09

## 2023-07-24 ENCOUNTER — Telehealth: Payer: 59

## 2023-07-26 ENCOUNTER — Ambulatory Visit: Payer: 59 | Admitting: Physician Assistant

## 2023-07-26 MED ORDER — ALBUTEROL SULFATE HFA 108 (90 BASE) MCG/ACT IN AERS: 2.0000 | INHALATION_SPRAY | Freq: Four times a day (QID) | RESPIRATORY_TRACT | 2 refills | Status: AC | PRN

## 2023-07-26 MED ORDER — GABAPENTIN 300 MG PO CAPS: ORAL_CAPSULE | ORAL | 1 refills | Status: AC

## 2023-07-31 ENCOUNTER — Encounter: Payer: Self-pay | Admitting: Nurse Practitioner

## 2023-08-02 ENCOUNTER — Other Ambulatory Visit: Payer: Self-pay

## 2023-08-02 DIAGNOSIS — Z6837 Body mass index (BMI) 37.0-37.9, adult: Secondary | ICD-10-CM

## 2023-08-06 ENCOUNTER — Ambulatory Visit: Payer: 59 | Admitting: Physician Assistant

## 2023-08-09 ENCOUNTER — Other Ambulatory Visit: Payer: Self-pay | Admitting: Nurse Practitioner

## 2023-08-09 DIAGNOSIS — E559 Vitamin D deficiency, unspecified: Secondary | ICD-10-CM

## 2023-08-09 MED ORDER — BLOOD GLUCOSE TEST VI STRP: 1.0000 | ORAL_STRIP | Freq: Three times a day (TID) | 1 refills | Status: DC

## 2023-08-09 MED ORDER — VITAMIN D3 1.25 MG (50000 UT) PO CAPS: 12.0000 | ORAL_CAPSULE | ORAL | 0 refills | Status: AC

## 2023-08-11 ENCOUNTER — Ambulatory Visit: Payer: 59 | Admitting: Physician Assistant

## 2023-08-12 ENCOUNTER — Ambulatory Visit: Payer: 59 | Admitting: Physician Assistant

## 2023-08-16 ENCOUNTER — Ambulatory Visit: Payer: 59 | Admitting: Pulmonary Disease

## 2023-08-28 ENCOUNTER — Other Ambulatory Visit: Payer: Self-pay | Admitting: Nurse Practitioner

## 2023-08-31 ENCOUNTER — Ambulatory Visit: Payer: 59 | Admitting: Pulmonary Disease

## 2023-08-31 ENCOUNTER — Ambulatory Visit (INDEPENDENT_AMBULATORY_CARE_PROVIDER_SITE_OTHER): Payer: 59 | Admitting: Physician Assistant

## 2023-08-31 ENCOUNTER — Encounter: Payer: Self-pay | Admitting: Pulmonary Disease

## 2023-08-31 ENCOUNTER — Ambulatory Visit (INDEPENDENT_AMBULATORY_CARE_PROVIDER_SITE_OTHER): Payer: 59 | Admitting: Nurse Practitioner

## 2023-08-31 ENCOUNTER — Encounter: Payer: Self-pay | Admitting: Nurse Practitioner

## 2023-08-31 VITALS — BP 134/94 | HR 103 | Wt 191.4 lb

## 2023-08-31 DIAGNOSIS — E782 Mixed hyperlipidemia: Secondary | ICD-10-CM

## 2023-08-31 DIAGNOSIS — F3162 Bipolar disorder, current episode mixed, moderate: Secondary | ICD-10-CM | POA: Diagnosis not present

## 2023-08-31 DIAGNOSIS — I252 Old myocardial infarction: Secondary | ICD-10-CM

## 2023-08-31 DIAGNOSIS — I10 Essential (primary) hypertension: Secondary | ICD-10-CM | POA: Diagnosis not present

## 2023-08-31 DIAGNOSIS — J0140 Acute pansinusitis, unspecified: Secondary | ICD-10-CM

## 2023-08-31 DIAGNOSIS — R7303 Prediabetes: Secondary | ICD-10-CM

## 2023-08-31 DIAGNOSIS — J440 Chronic obstructive pulmonary disease with acute lower respiratory infection: Secondary | ICD-10-CM | POA: Diagnosis not present

## 2023-08-31 DIAGNOSIS — M25562 Pain in left knee: Secondary | ICD-10-CM | POA: Diagnosis not present

## 2023-08-31 DIAGNOSIS — Z6838 Body mass index (BMI) 38.0-38.9, adult: Secondary | ICD-10-CM | POA: Diagnosis not present

## 2023-08-31 DIAGNOSIS — G8929 Other chronic pain: Secondary | ICD-10-CM | POA: Diagnosis not present

## 2023-08-31 HISTORY — DX: Acute pansinusitis, unspecified: J01.40

## 2023-08-31 MED ORDER — AMOXICILLIN-POT CLAVULANATE 875-125 MG PO TABS
1.0000 | ORAL_TABLET | Freq: Two times a day (BID) | ORAL | 0 refills | Status: DC
Start: 2023-08-31 — End: 2024-04-14

## 2023-08-31 MED ORDER — WEGOVY 0.5 MG/0.5ML ~~LOC~~ SOAJ: 0.5000 mg | SUBCUTANEOUS | 0 refills | Status: DC

## 2023-08-31 MED ORDER — AMLODIPINE BESYLATE-VALSARTAN 5-160 MG PO TABS
1.0000 | ORAL_TABLET | Freq: Every day | ORAL | 3 refills | Status: DC
Start: 2023-08-31 — End: 2024-04-14

## 2023-08-31 MED ORDER — PREDNISONE 20 MG PO TABS
ORAL_TABLET | ORAL | 0 refills | Status: DC
Start: 2023-08-31 — End: 2024-04-14

## 2023-08-31 MED ORDER — LIDOCAINE HCL 1 % IJ SOLN
3.0000 mL | INTRAMUSCULAR | Status: AC | PRN
Start: 2023-08-31 — End: 2023-08-31
  Administered 2023-08-31: 3 mL

## 2023-08-31 MED ORDER — WEGOVY 0.25 MG/0.5ML ~~LOC~~ SOAJ
0.2500 mg | SUBCUTANEOUS | 0 refills | Status: DC
Start: 2023-08-31 — End: 2024-04-14

## 2023-08-31 MED ORDER — METHYLPREDNISOLONE ACETATE 40 MG/ML IJ SUSP
40.0000 mg | INTRAMUSCULAR | Status: AC | PRN
Start: 2023-08-31 — End: 2023-08-31
  Administered 2023-08-31: 40 mg via INTRA_ARTICULAR

## 2023-08-31 MED ORDER — WEGOVY 1 MG/0.5ML ~~LOC~~ SOAJ: 1.0000 mg | SUBCUTANEOUS | 0 refills | Status: DC

## 2023-08-31 NOTE — Assessment & Plan Note (Signed)
Reports significant weight gain despite dietary efforts and medication. She has been working on this for the last year without any success, but rather only increased weight. . Weight is contributing to back pain and overall physical discomfort. Discussed the role of insulin resistance and the importance of diet and exercise. Currently on metformin. Discussed potential benefits and side effects of Wegovy for weight loss, including gastrointestinal issues and the need for prior authorization.  I am hopeful that this medication is approved as I do feel it will be beneficial for her. There is significant concern with her increased weight and the impact this has on her co-morbidities of htn, hld, fatty liver, and copd as well as her bones.  - Submit prior authorization for [facility] for weight loss   - Discuss with insurance about coverage for weight loss medications

## 2023-08-31 NOTE — Assessment & Plan Note (Signed)
Reports oxygen levels dropping to 88-89% at night, requiring her to sleep sitting up. Symptoms include cough, dyspnea with exertion, and thick lung sounds. Discussed the importance of maintaining oxygen levels between 88-94% and the risks of both hypoxia and hyperoxia.   - Provide information on managing COPD and oxygen levels   -treatment for acute exacerbation today with steroid taper and antibiotics.
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
## 2023-08-31 NOTE — Assessment & Plan Note (Signed)
Blood pressure readings have been consistently high, with recent readings as high as 189/121. Symptoms include headaches and dizziness. Discussed the risks of uncontrolled hypertension including stroke and heart attack, and the benefits of medication adjustment.   - Change blood pressure medication to a combination of amlodipine and valsartan

## 2023-08-31 NOTE — Assessment & Plan Note (Signed)
Symptoms consistent with sinusitis and acute COPD exacerbation. Sending treatment with Augmentin and prednisone. Recommend close monitoring and report if oxygen saturations drop below 88 and do not rebound back up in 5 minutes.

## 2023-08-31 NOTE — Assessment & Plan Note (Signed)
Blood sugar levels are generally well-controlled, with recent readings around 100-142 mg/dL. Discussed the importance of diet and exercise in managing insulin resistance and the role of metformin in controlling blood sugar levels.   - Continue metformin   - wegovy sent for weight

## 2023-08-31 NOTE — Assessment & Plan Note (Signed)
Chronic. Labs pending. No changes at this time.
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
## 2023-08-31 NOTE — Assessment & Plan Note (Signed)
Currently not well controlled with mood fluctuations associated with weight gain significantly contributing to a depressed mental state. She has had an elevation in her speech and behaviors the last couple of visits, but not enough to consider mania/hypomania. I do truly feel that she will have some improvement once she is in with psychiatry and can get stabilized on the right medication. We will also start working on weight, which will help her concerns. Any medications should be weight neutral to reduce the risk of rebound weight gain.

## 2023-08-31 NOTE — Assessment & Plan Note (Signed)
Reported "mild" in 2011. No concerns present today, but do feel that management of chronic conditions is imperative to prevent further cardiac events. Will send treatment with wegovy.

## 2023-08-31 NOTE — Assessment & Plan Note (Signed)
>>  ASSESSMENT AND PLAN FOR BMI 38.0-38.9,ADULT WRITTEN ON 08/31/2023 12:50 PM BY Nancy Decker E, NP  Reports significant weight gain despite dietary efforts and medication. She has been working on this for the last year without any success, but rather only increased weight. . Weight is contributing to back pain and overall physical discomfort. Discussed the role of insulin resistance and the importance of diet and exercise. Currently on metformin. Discussed potential benefits and side effects of Wegovy for weight loss, including gastrointestinal issues and the need for prior authorization.  I am hopeful that this medication is approved as I do feel it will be beneficial for her. There is significant concern with her increased weight and the impact this has on her co-morbidities of htn, hld, fatty liver, and copd as well as her bones.  - Submit prior authorization for [facility] for weight loss   - Discuss with insurance about coverage for weight loss medications

## 2023-08-31 NOTE — Patient Instructions (Addendum)
I have sent in a new Medication to try called [facility] for weight loss.   I also sent in Augmentin for the infection.   I changed your blood pressure medication to amlodipine-valsartan.   Chronic Obstructive Pulmonary Disease  Chronic obstructive pulmonary disease (COPD) is a long-term (chronic) lung problem. When you have COPD, it can feel harder to breathe in or out. The condition may get worse over time. There are things you can do to keep yourself as healthy as possible. What are the causes? Smoking. This is the most common cause. Breathing in fumes, smoke, or chemicals for a long time. Genes that are inherited, which means they are passed down from parent to child. What are the signs or symptoms? Shortness of breath. This may happen all the time. This may get worse when you move your body. This may get worse over time. You may have times when this becomes much worse all of a sudden. These are called flare-ups or exacerbations. A long-term cough, with or without thick mucus. Wheezing. Chest tightness. Feeling tired. Not being able to do activities like you used to do. How is this diagnosed? This condition is diagnosed based on: Your medical history. A physical exam. Lung (pulmonary) function tests. You may have a test that measures the air flow out of the lungs when you breathe out. You may also have tests, including: Chest X-ray. CT scan. Blood tests. How is this treated? This condition may be treated by: Quitting smoking, if you smoke. Using oxygen. Taking medicines. These may include: Inhalers. These have medicines in them that you breathe in. Daily inhalers. These help to prevent symptoms from happening. They are usually taken every day to prevent COPD flare-ups. Quick relief inhalers. These act fast to relieve symptoms. They are used only when needed and provide short-term relief. Other medicines that you breathe in or swallow. These may be used to open the airways,  thin mucus, or treat infections. Breathing exercises to help you control or catch your breath. A mucus clearing device, if you have a lot of thick mucus. Pulmonary rehab. A place where you will learn about your condition and the best ways for you to manage it. Surgery. Follow these instructions at home: Medicines Take your medicines as told by your health care provider. Talk to your provider before taking any cough or allergy medicines. You may need to avoid medicines that cause your lungs to be dry. Lifestyle Several times a day, wash your hands with soap and water for at least 20 seconds. If you cannot use soap and water, use hand sanitizer. This may help keep you from getting an infection. Avoid being around crowds or people who are sick. Do not smoke or use any products that contain nicotine or tobacco. If you need help quitting, ask your provider. Stay active. Learn how to pace your activity during the day. Learn how to breathe to control your stress and catch your breath. Drink enough fluid to keep your pee (urine) pale yellow, unless you have been told not to. Eat healthy foods. Eat smaller meals more often. Get enough sleep. Most adults need 7 or more hours per night. General instructions Make a COPD action plan with your provider. This helps you to know what to do if you feel worse than usual. Make sure you get all the shots, also called vaccines, that your provider recommends. Ask your provider about a flu shot and a pneumonia shot. If you need home oxygen therapy, ask your  provider how often to check your oxygen level with a device called an oximeter. Keep all follow-up visits to review your COPD action plan. Your provider will want to check on your condition often to keep you healthy and out of the hospital. Contact a health care provider if: You are coughing up more mucus than usual. There is a change in the color or thickness of the mucus. It is harder to breathe than usual  or you are short of breath while you are resting. You need to use your quick relief inhaler more often. You have trouble doing your normal activities such as getting dressed or walking in the house. Your skin color or fingernails turn blue. You have a fever or chills. Get help right away if: You are short of breath and cannot: Talk in full sentences. Do normal activities. You have chest pain. You feel confused. These symptoms may be an emergency. Call 911 right away. Do not wait to see if the symptoms will go away. Do not drive yourself to the hospital. This information is not intended to replace advice given to you by your health care provider. Make sure you discuss any questions you have with your health care provider. Document Revised: 04/22/2023 Document Reviewed: 10/05/2022 Elsevier Patient Education  2024 [employer].

## 2023-09-01 ENCOUNTER — Telehealth: Payer: Self-pay | Admitting: Nurse Practitioner

## 2023-09-01 ENCOUNTER — Encounter: Payer: Self-pay | Admitting: Nurse Practitioner

## 2023-09-01 NOTE — Telephone Encounter (Signed)
Pt is asking if you will take her grandson Nancy Decker 10/02/2006 on as a new patient?

## 2023-09-02 ENCOUNTER — Other Ambulatory Visit (HOSPITAL_COMMUNITY): Payer: Self-pay

## 2023-09-02 ENCOUNTER — Telehealth: Payer: Self-pay

## 2023-09-02 NOTE — Telephone Encounter (Signed)
Pharmacy Patient Advocate Encounter  (Submitting to Primary first as this document is needed to submit to secondary-INS/ MEDICAID)    Received notification from CoverMyMeds that prior authorization for [facility] is required/requested.   Insurance verification completed.   The patient is insured through [facility] .   Per test claim: PA required; PA submitted to above mentioned insurance via CoverMyMeds Key/confirmation #/EOC (Key: [id number]) Status is pending

## 2023-09-06 ENCOUNTER — Other Ambulatory Visit (HOSPITAL_COMMUNITY): Payer: Self-pay

## 2023-09-06 ENCOUNTER — Telehealth: Payer: Self-pay

## 2023-09-06 NOTE — Telephone Encounter (Signed)
Pharmacy Patient Advocate Encounter   Received notification from CoverMyMeds that prior authorization for [facility] is required/requested.   Insurance verification completed.   The patient is insured through [facility] .   Per test claim: PA required; PA submitted to above mentioned insurance via CoverMyMeds Key/confirmation #/EOC (Key: [id number])   Status is pending

## 2023-09-07 ENCOUNTER — Other Ambulatory Visit (HOSPITAL_COMMUNITY): Payer: Self-pay

## 2023-09-07 NOTE — Telephone Encounter (Signed)
 Pharmacy Patient Advocate Encounter  Received notification from [facility] that Prior Authorization for Wegovy  has been APPROVED from 2.4.25 to 8.4.25. Ran test claim, Copay is $4.00. This test claim was processed through [facility]- copay amounts may vary at other pharmacies due to pharmacy/plan contracts, or as the patient moves through the different stages of their insurance plan.   PA #/Case ID/Reference #: [id number]

## 2023-09-08 ENCOUNTER — Telehealth: Payer: Self-pay

## 2023-09-08 ENCOUNTER — Other Ambulatory Visit (HOSPITAL_COMMUNITY): Payer: Self-pay

## 2023-09-08 NOTE — Telephone Encounter (Signed)
 Pharmacy Patient Advocate Encounter   Received notification from CoverMyMeds that prior authorization for QUEtiapine  Fumarate 200MG  tablets is required/requested.   Insurance verification completed.   The patient is insured through [facility] .   Per test claim: PA required; PA submitted to above mentioned insurance via CoverMyMeds Key/confirmation #/EOC (Key: [id number])    Status is pending

## 2023-09-09 ENCOUNTER — Other Ambulatory Visit (HOSPITAL_COMMUNITY): Payer: Self-pay

## 2023-09-09 NOTE — Telephone Encounter (Signed)
 Pharmacy Patient Advocate Encounter  Received notification from [facility] that Prior Authorization for QUEtiapine  Fumarate 200MG  tablets has been DENIED.  Full denial letter will be uploaded to the media tab. See denial reason below.  PT GETS RX VIA [employer]ET[employer] PLAN /PRIMARY INS !

## 2023-09-20 ENCOUNTER — Telehealth (HOSPITAL_COMMUNITY): Payer: 59 | Admitting: Psychiatry

## 2023-10-13 ENCOUNTER — Other Ambulatory Visit (HOSPITAL_COMMUNITY): Payer: Self-pay

## 2023-10-16 ENCOUNTER — Other Ambulatory Visit: Payer: Self-pay | Admitting: Pulmonary Disease

## 2023-10-20 ENCOUNTER — Encounter: Payer: Self-pay | Admitting: Physician Assistant

## 2023-10-21 ENCOUNTER — Other Ambulatory Visit: Payer: 59

## 2023-10-21 ENCOUNTER — Encounter: Payer: Self-pay | Admitting: Physician Assistant

## 2023-10-28 ENCOUNTER — Ambulatory Visit: Payer: Self-pay

## 2023-10-28 NOTE — Telephone Encounter (Signed)
 Copied from CRM [id number]. Topic: Medical Record Request - Other >> Oct 28, 2023 11:41 AM Duncan Dull wrote: Reason for CRM: John from [employer] is calling to request medical records for patient Contact number [phone]   Spoke with Dolly Rias. At [employer], instructed to disregard at this time as medical records may have already been allocated.

## 2023-12-09 ENCOUNTER — Ambulatory Visit: Admitting: Medical

## 2023-12-13 ENCOUNTER — Ambulatory Visit: Admitting: Physician Assistant
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
## 2023-12-14 ENCOUNTER — Ambulatory Visit: Payer: MEDICAID | Admitting: Family Medicine

## 2023-12-16 ENCOUNTER — Encounter: Payer: Self-pay | Admitting: Nurse Practitioner

## 2023-12-17 ENCOUNTER — Ambulatory Visit: Admitting: Physician Assistant

## 2023-12-24 ENCOUNTER — Ambulatory Visit
Admission: RE | Admit: 2023-12-24 | Discharge: 2023-12-24 | Disposition: A | Discharge status: Home / Self Care | Payer: MEDICAID | Source: Ambulatory Visit | Attending: Physician Assistant | Admitting: Physician Assistant

## 2023-12-24 DIAGNOSIS — G8929 Other chronic pain: Secondary | ICD-10-CM

## 2023-12-29 ENCOUNTER — Ambulatory Visit: Admitting: Physician Assistant

## 2024-01-10 ENCOUNTER — Other Ambulatory Visit: Payer: Self-pay | Admitting: Nurse Practitioner

## 2024-01-24 ENCOUNTER — Telehealth: Payer: MEDICAID

## 2024-01-26 ENCOUNTER — Other Ambulatory Visit: Payer: Self-pay | Admitting: Physician Assistant

## 2024-01-26 DIAGNOSIS — M545 Low back pain, unspecified: Secondary | ICD-10-CM

## 2024-02-08 ENCOUNTER — Encounter: Payer: Self-pay | Admitting: Nurse Practitioner

## 2024-02-08 ENCOUNTER — Ambulatory Visit: Payer: MEDICAID | Admitting: Nurse Practitioner

## 2024-02-08 NOTE — Progress Notes (Deleted)
  Catheline Doing, DNP, AGNP-c [facility]  [address] [place], [place] [address] [phone]  ESTABLISHED PATIENT- Chronic Health and/or Follow-Up Visit  There were no vitals taken for this visit.    Nancy Decker is a 53 y.o. year old female presenting today for evaluation and management of chronic conditions.     All ROS negative with exception of what is listed above.   PHYSICAL EXAM Physical Exam   PLAN Problem List Items Addressed This Visit   None   No follow-ups on file.  Catheline Doing, DNP, AGNP-c

## 2024-02-10 ENCOUNTER — Other Ambulatory Visit: Payer: Self-pay | Admitting: Pulmonary Disease

## 2024-02-11 NOTE — Telephone Encounter (Signed)
 Patient will need to keep upcoming office visit 03/10/2024 to obtain further refills.

## 2024-02-21 ENCOUNTER — Ambulatory Visit: Payer: MEDICAID | Admitting: Physician Assistant

## 2024-03-10 ENCOUNTER — Ambulatory Visit: Admitting: Pulmonary Disease

## 2024-04-08 ENCOUNTER — Other Ambulatory Visit: Payer: Self-pay | Admitting: Nurse Practitioner

## 2024-04-08 DIAGNOSIS — F5104 Psychophysiologic insomnia: Secondary | ICD-10-CM

## 2024-04-08 DIAGNOSIS — F332 Major depressive disorder, recurrent severe without psychotic features: Secondary | ICD-10-CM

## 2024-04-08 DIAGNOSIS — F4312 Post-traumatic stress disorder, chronic: Secondary | ICD-10-CM

## 2024-04-10 NOTE — Telephone Encounter (Signed)
 Last apt 08/31/23.

## 2024-04-11 ENCOUNTER — Encounter: Payer: Self-pay | Admitting: Nurse Practitioner

## 2024-04-14 ENCOUNTER — Ambulatory Visit: Payer: Self-pay

## 2024-04-14 ENCOUNTER — Encounter: Payer: Self-pay | Admitting: Nurse Practitioner

## 2024-04-14 ENCOUNTER — Ambulatory Visit: Payer: MEDICAID | Admitting: Nurse Practitioner

## 2024-04-14 VITALS — BP 144/98 | HR 114 | Wt 180.2 lb

## 2024-04-14 DIAGNOSIS — I10 Essential (primary) hypertension: Secondary | ICD-10-CM | POA: Diagnosis not present

## 2024-04-14 DIAGNOSIS — R41840 Attention and concentration deficit: Secondary | ICD-10-CM

## 2024-04-14 DIAGNOSIS — F332 Major depressive disorder, recurrent severe without psychotic features: Secondary | ICD-10-CM

## 2024-04-14 DIAGNOSIS — F3132 Bipolar disorder, current episode depressed, moderate: Secondary | ICD-10-CM

## 2024-04-14 MED ORDER — AMLODIPINE-VALSARTAN-HCTZ 10-320-25 MG PO TABS: 1.0000 | ORAL_TABLET | Freq: Every day | ORAL | 1 refills | Status: AC

## 2024-04-14 MED ORDER — ARIPIPRAZOLE 10 MG PO TABS
10.0000 mg | ORAL_TABLET | Freq: Every day | ORAL | 2 refills | Status: AC
Start: 2024-04-14 — End: ?

## 2024-04-14 NOTE — Assessment & Plan Note (Signed)
 Reports of impaired attention and concentration, with symptoms of being 'pacey'. Possible ADHD suggested by a previous doctor. Symptoms may overlap with depression and anxiety. - Refer for ADHD testing to confirm diagnosis.

## 2024-04-14 NOTE — Assessment & Plan Note (Signed)
 Blood pressure remains uncontrolled with readings up to 200/101 mmHg. Symptoms include nausea during elevated blood pressure episodes. Current medication regimen is inadequate. - Increase dosage of current antihypertensive medication. - Monitor blood pressure at home and record readings. - Send prescription to [facility].

## 2024-04-14 NOTE — Patient Instructions (Addendum)
 Aripiprazole  Tablets What is this medication? ARIPIPRAZOLE  (ay ri PIP ray zole) treats  bipolar I disorder, autism spectrum disorder, depression. It works by balancing the levels of dopamine and serotonin in the brain, hormones that help regulate mood, behaviors, and thoughts. It belongs to a group of medications called antipsychotics. Antipsychotics can be used to treat several kinds of mental health conditions. This medicine may be used for other purposes; ask your health care provider or pharmacist if you have questions. COMMON BRAND NAME(S): Abilify  What should I tell my care team before I take this medication? They need to know if you have any of these conditions: Dementia Diabetes Difficulty swallowing Have trouble controlling your muscles Heart disease History of irregular heartbeat History of stroke Low blood cell levels (white cells, red cells, and platelets) Low blood pressure Parkinson disease Seizures Suicidal thoughts, plans, or attempt by you or a family member Urges to engage in impulsive behaviors in ways that are unusual for you An unusual or allergic reaction to aripiprazole , other medications, foods, dyes, or preservatives Pregnant or trying to get pregnant Breastfeeding How should I use this medication? Take this medication by mouth with a glass of water. Take it as directed on the prescription label at the same time every day. You can take it with or without food. If it upsets your stomach, take it with food. Do not take your medication more often than directed. Keep taking it unless your care team tells you to stop. A special MedGuide will be given to you by the pharmacist with each prescription and refill. Be sure to read this information carefully each time. Talk to your care team about the use of this medication in children. While it may be prescribed for children as young as 6 years for selected conditions, precautions do apply. Overdosage: If you think you have  taken too much of this medicine contact a poison control center or emergency room at once. NOTE: This medicine is only for you. Do not share this medicine with others. What if I miss a dose? If you miss a dose, take it as soon as you can. If it is almost time for your next dose, take only that dose. Do not take double or extra doses. What may interact with this medication? Do not take this medication with any of the following: Brexpiprazole Cisapride Dextromethorphan; quinidine Dronedarone Metoclopramide Pimozide Quinidine Thioridazine This medication may also interact with the following: Antihistamines for allergy, cough, and cold Carbamazepine Certain medications for anxiety or sleep Certain medications for depression, such as amitriptyline, fluoxetine, paroxetine, or sertraline Certain medications for fungal infections, such as fluconazole, itraconazole, ketoconazole, posaconazole, or voriconazole Clarithromycin General anesthetics, such as halothane, isoflurane, methoxyflurane, or propofol Medications for Parkinson disease, such as levodopa Medications for blood pressure Medications for seizures Medications that relax muscles for surgery Opioid medications for pain Other medications that cause heart rhythm changes Phenothiazines, such as chlorpromazine or prochlorperazine Rifampin This list may not describe all possible interactions. Give your health care provider a list of all the medicines, herbs, non-prescription drugs, or dietary supplements you use. Also tell them if you smoke, drink alcohol, or use illegal drugs. Some items may interact with your medicine. What should I watch for while using this medication? Visit your care team for regular checks on your progress. Tell your care team if your symptoms do not start to get better or if they get worse. Do not suddenly stop taking this medication. You may develop a severe reaction. Your  care team will tell you how much  medication to take. If your care team wants you to stop the medication, the dose may be slowly lowered over time to avoid any side effects. Patients and their families should watch out for new or worsening depression or thoughts of suicide. Also watch out for sudden changes in feelings such as feeling anxious, agitated, panicky, irritable, hostile, aggressive, impulsive, severely restless, overly excited and hyperactive, or not being able to sleep. If this happens, especially at the beginning of antidepressant treatment or after a change in dose, call your care team. This medication may affect your coordination, reaction time, or judgment. Do not drive or operate machinery until you know how this medication affects you. Sit up or stand slowly to reduce the risk of dizzy or fainting spells. Drinking alcohol with this medication can increase the risk of these side effects. This medication can cause problems with controlling your body temperature. It can lower the response of your body to cold temperatures. If possible, stay indoors during cold weather. If you must go outdoors, wear warm clothes. It can also lower the response of your body to heat. Do not overheat. Do not over-exercise. Stay out of the sun when possible. If you must be in the sun, wear cool clothing. Drink plenty of water. If you have trouble controlling your body temperature, call your care team right away. This medication may cause dry eyes and blurred vision. If you wear contact lenses, you may feel some discomfort. Lubricating eye drops may help. See your care team if the problem does not go away or is severe. This medication may increase blood sugar. Ask your care team if changes in diet or medications are needed if you have diabetes. There have been reports of increased sexual urges or other strong urges such as gambling while taking this medication. If you experience any of these while taking this medication, you should report this to your  care team as soon as possible. What side effects may I notice from receiving this medication? Side effects that you should report to your care team as soon as possible: Allergic reactions--skin rash, itching, hives, swelling of the face, lips, tongue, or throat High blood sugar (hyperglycemia)--increased thirst or amount of urine, unusual weakness or fatigue, blurry vision High fever, stiff muscles, increased sweating, fast or irregular heartbeat, and confusion, which may be signs of neuroleptic malignant syndrome Low blood pressure--dizziness, feeling faint or lightheaded, blurry vision Pain or trouble swallowing Prolonged or painful erection Seizures Stroke--sudden numbness or weakness of the face, arm, or leg, trouble speaking, confusion, trouble walking, loss of balance or coordination, dizziness, severe headache, change in vision Uncontrolled and repetitive body movements, muscle stiffness or spasms, tremors or shaking, loss of balance or coordination, restlessness, shuffling walk, which may be signs of extrapyramidal symptoms (EPS) Thoughts of suicide or self-harm, worsening mood, feelings of depression Urges to engage in impulsive behaviors such as gambling, binge eating, sexual activity, or shopping in ways that are unusual for you Side effects that usually do not require medical attention (report these to your care team if they continue or are bothersome): Constipation Drowsiness Weight gain This list may not describe all possible side effects. Call your doctor for medical advice about side effects. You may report side effects to FDA at 1-800-FDA-1088. Where should I keep my medication? Keep out of the reach of children and pets. Store at room temperature between 15 and 30 degrees C (59 and 86 degrees F). Throw  away any unused medication after the expiration date. NOTE: This sheet is a summary. It may not cover all possible information. If you have questions about this medicine, talk  to your doctor, pharmacist, or health care provider.  2024 Elsevier/Gold Standard (2022-02-07 00:00:00)

## 2024-04-14 NOTE — Telephone Encounter (Signed)
 FYI Only or Action Required?: FYI only for provider.  Patient was last seen in primary care on 08/31/2023 by Early, Camie BRAVO, NP.  Called Nurse Triage reporting Depression.  Symptoms began several weeks ago.  Interventions attempted: Rest, hydration, or home remedies.  Symptoms are: unchanged.  Triage Disposition: See Physician Within 24 Hours  Patient/caregiver understands and will follow disposition?: Yes  Copied from CRM #[id number]. Topic: Clinical - Red Word Triage >> Apr 14, 2024 12:30 PM Rachelle R wrote: [facility] that prompted transfer to Nurse Triage: Patient had an appointment today but thought was at 1. Needs to be seen asap for depression. Reason for Disposition  [1] Depression AND [2] getting worse (e.g., sleeping poorly, less able to do activities of daily living)  Answer Assessment - Initial Assessment Questions 1. CONCERN: What happened that made you call today?     Patient was scheduled to see PCP today but missed the appointment.  2. DEPRESSION SYMPTOM SCREENING: How are you feeling overall? (e.g., decreased energy, increased sleeping or difficulty sleeping, difficulty concentrating, feelings of sadness, guilt, hopelessness, or worthlessness)     Feelings of sadness, increased sleeping 3. RISK OF HARM - SUICIDAL IDEATION:  Do you ever have thoughts of hurting or killing yourself?  (e.g., yes, no, no but preoccupation with thoughts about death)     no 4. RISK OF HARM - HOMICIDAL IDEATION:  Do you ever have thoughts of hurting or killing someone else?  (e.g., yes, no, no but preoccupation with thoughts about death)     no 5. FUNCTIONAL IMPAIRMENT: How have things been going for you overall? Have you had more difficulty than usual doing your normal daily activities?  (e.g., better, same, worse; self-care, school, work, interactions)     More difficulty getting activities done daily.  6. SUPPORT: Who is with you now? Who do you live with? Do you have family or  friends who you can talk to?      Patient lives with husband and grandchild.  7. THERAPIST: Do you have a counselor or therapist? If Yes, ask: What is their name?     no 8. STRESSORS: Has there been any new stress or recent changes in your life?     Continuing to deal with the death of family members  9. ALCOHOL USE OR SUBSTANCE USE (DRUG USE): Do you drink alcohol or use any illegal drugs?     no 10. OTHER: Do you have any other physical symptoms right now? (e.g., fever)       no  Protocols used: Depression-A-AH

## 2024-04-14 NOTE — Assessment & Plan Note (Signed)
 Persistent depressive symptoms, including low mood, hopelessness, and low energy, exacerbated by recent bereavement. No suicidal ideation. Seroquel  is insufficient; considering augmentation with Abilify . Hesitant about counseling but finds diary writing helpful. - Prescribe Abilify  (aripiprazole ) once daily, with or without food, at the same time each day. - Continue Seroquel  as previously prescribed. - Educate on the expected timeline for Abilify  efficacy, noting improvement may be seen in 2 weeks, with full effect by 6 weeks.
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
## 2024-04-18 ENCOUNTER — Ambulatory Visit: Payer: MEDICAID | Admitting: Physician Assistant

## 2024-05-02 ENCOUNTER — Ambulatory Visit: Payer: MEDICAID | Admitting: Physician Assistant

## 2024-05-02 DIAGNOSIS — M542 Cervicalgia: Secondary | ICD-10-CM

## 2024-05-02 DIAGNOSIS — M25562 Pain in left knee: Secondary | ICD-10-CM

## 2024-05-02 DIAGNOSIS — G8929 Other chronic pain: Secondary | ICD-10-CM | POA: Diagnosis not present

## 2024-05-02 MED ORDER — METHYLPREDNISOLONE ACETATE 40 MG/ML IJ SUSP
40.0000 mg | INTRAMUSCULAR | Status: AC | PRN
Start: 2024-05-02 — End: 2024-05-02
  Administered 2024-05-02: 40 mg via INTRA_ARTICULAR

## 2024-05-02 MED ORDER — LIDOCAINE HCL 1 % IJ SOLN
4.0000 mL | INTRAMUSCULAR | Status: AC | PRN
Start: 2024-05-02 — End: 2024-05-02
  Administered 2024-05-02: 4 mL
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
## 2024-05-02 NOTE — Addendum Note (Signed)
 Addended by: RODGERS LACY on: 05/02/2024 02:20 PM   Modules accepted: Orders

## 2024-05-05 ENCOUNTER — Telehealth: Payer: Self-pay

## 2024-05-08 NOTE — Progress Notes (Deleted)
 05/09/24- Acute visit- LOV  Dr Neda 02/05/23 Hx of COPD, sleep apnea, insomnia, history of depression

## 2024-05-09 ENCOUNTER — Ambulatory Visit: Payer: MEDICAID | Admitting: Internal Medicine

## 2024-05-09 ENCOUNTER — Encounter: Payer: Self-pay | Admitting: Internal Medicine

## 2024-05-21 ENCOUNTER — Other Ambulatory Visit

## 2024-05-24 ENCOUNTER — Other Ambulatory Visit

## 2024-06-05 ENCOUNTER — Encounter: Payer: Self-pay | Admitting: Radiology

## 2024-06-16 ENCOUNTER — Ambulatory Visit: Admitting: Physical Medicine and Rehabilitation

## 2024-06-23 ENCOUNTER — Ambulatory Visit: Admitting: Physical Medicine and Rehabilitation

## 2024-06-26 ENCOUNTER — Telehealth: Payer: MEDICAID | Admitting: Nurse Practitioner

## 2024-06-26 ENCOUNTER — Encounter: Payer: Self-pay | Admitting: Nurse Practitioner

## 2024-06-26 VITALS — Wt 170.0 lb

## 2024-06-26 DIAGNOSIS — R300 Dysuria: Secondary | ICD-10-CM | POA: Insufficient documentation

## 2024-06-26 DIAGNOSIS — F3132 Bipolar disorder, current episode depressed, moderate: Secondary | ICD-10-CM | POA: Insufficient documentation

## 2024-06-26 DIAGNOSIS — I10 Essential (primary) hypertension: Secondary | ICD-10-CM

## 2024-06-26 DIAGNOSIS — N898 Other specified noninflammatory disorders of vagina: Secondary | ICD-10-CM | POA: Insufficient documentation

## 2024-06-26 DIAGNOSIS — F332 Major depressive disorder, recurrent severe without psychotic features: Secondary | ICD-10-CM | POA: Diagnosis not present

## 2024-06-26 MED ORDER — NITROFURANTOIN MONOHYD MACRO 100 MG PO CAPS: 100.0000 mg | ORAL_CAPSULE | Freq: Two times a day (BID) | ORAL | 0 refills | Status: AC

## 2024-06-26 NOTE — Progress Notes (Signed)
 Virtual Visit Encounter mychart visit.   I connected with  Nancy Decker on 06/26/24 at  3:15 PM EST by secure video and audio telemedicine application. I verified that I am speaking with the correct person using two identifiers.   I introduced myself as a Publishing Rights Manager with the practice. The limitations of evaluation and management by telemedicine discussed with the patient and the availability of in person appointments. The patient expressed verbal understanding and consent to proceed.  Participating parties in this visit include: Myself and patient  The patient is: Patient Location: Home I am: Provider Location: Office/Clinic Subjective:    CC and HPI:  History of Present Illness Nancy Decker is a 53 year old female with bipolar depression who presents for a 6 week medication check.  She was recently seen in September for bipolar depression disorder and was prescribed Abilify , but she has not started the medication as it is still at the pharmacy. Her husband is picking it up today. She missed her recent psychiatry appointment and plans to reschedule it. Her mood is described as 'hanging in there' and is noted to be better than a few weeks ago.  Her blood pressure has been elevated despite being on amlodipine , valsartan , and hydrochlorothiazide (10-320-25 mg). She monitors her blood pressure at home but mentions needing a new blood pressure cuff. She plans to purchase this today. She feels her BP is likely elevated due to upcoming Thanksgiving and the stressors with preparing for this.   She is concerned about swelling inside her vagina and difficulty urinating. She has experienced some back pain and has used Azo, but feels the issue might be more serious. She does not have a gynecologist and has not noticed any odor or discharge. She denies R/F for ST infection or other vaginal infection. She is interested in a referral for GYN    Past medical history, Surgical history,  Family history not pertinant except as noted below, Social history, Allergies, and medications have been entered into the medical record, reviewed, and corrections made.   Review of Systems:  All review of systems negative except what is listed in the HPI  Objective:    Alert and oriented x 4 Speaking in clear sentences with no shortness of breath. No distress.  Impression and Recommendations:    Problem List Items Addressed This Visit     Major depressive disorder, recurrent episode, severe (HCC)   Depressive symptoms appear to be better controlled at this time. She has not yet started abilify , but remains on seroquel . Unfortunately, she missed her psychiatry appointment, but she plans to make a new one after our call today, which is reassuring. I have encouraged her to follow through with this to ensure that we have the most appropriate treatment options available for her. No alarm symptoms present at this time. Will continue to monitor. Recommend starting Abilify  as soon as possible to help with mood, particularly during these more stressful times of the year.       Primary hypertension   Home BP cuff is not working properly. She plans to buy a new on today and continue monitoring. She is not sure if her BP has been higher because her cuff was not working or increased stress with preparing for Thanksgiving She would like to continue to monitor her BP at home over the next 2 weeks and she will let me know if the readings are higher than 130/80.      Vaginal irritation  Reports of vaginal swelling and irritation of unknown etiology. We were on a virtual visit today, therefore, unable to evaluate the area. She does not have a GYN and is not sure when her last pap smear was completed. She denies any symptoms of ST infection or concerns for infection. She reports urinary hesitancy and dysuria as well. It is unclear if this is related to the swelling or not. Given that we are coming up on a  holiday, I will send in prophylactic treatment with macrobid  in the event a UTI is present and contributing. Urgent referral to GYN placed. Patient encouraged to make an appointment as soon as possible.       Relevant Orders   Ambulatory referral to Obstetrics / Gynecology   Bipolar affective disorder, currently depressed, moderate (HCC) - Primary   See depression.      Dysuria   Dysuria and hesitancy reported with urination. Vaginal swelling also reported, but patient denies any other symptoms or concerns for ST infections. Given that we are upon a holiday week where the office will be closed for a few days, will go ahead and send treatment with macrobid . Referral for GYN placed for vaginal swelling.       Relevant Medications   nitrofurantoin , macrocrystal-monohydrate, (MACROBID ) 100 MG capsule    orders and follow up as documented in EMR I discussed the assessment and treatment plan with the patient. The patient was provided an opportunity to ask questions and all were answered. The patient agreed with the plan and demonstrated an understanding of the instructions.   The patient was advised to call back or seek an in-person evaluation if the symptoms worsen or if the condition fails to improve as anticipated.  Follow-Up: in 3 months  I provided 20 minutes of non-face-to-face interaction with this non face-to-face encounter including intake, same-day documentation, and chart review.   Camie CHARLENA Doing, NP , DNP, AGNP-c [facility]

## 2024-06-26 NOTE — Assessment & Plan Note (Signed)
 Reports of vaginal swelling and irritation of unknown etiology. We were on a virtual visit today, therefore, unable to evaluate the area. She does not have a GYN and is not sure when her last pap smear was completed. She denies any symptoms of ST infection or concerns for infection. She reports urinary hesitancy and dysuria as well. It is unclear if this is related to the swelling or not. Given that we are coming up on a holiday, I will send in prophylactic treatment with macrobid  in the event a UTI is present and contributing. Urgent referral to GYN placed. Patient encouraged to make an appointment as soon as possible.

## 2024-06-26 NOTE — Patient Instructions (Addendum)
 Nancy Decker,  I sent a referral to the gynecologist to get you in for evaluation of swelling. Let me know if the medication for UTI helps.   I would like you to start on the Abilify  as soon as you can to help keep your mood stable during the holidays. I know this can be a time where mood fluctuations are common and seem to come easier.  Please schedule with the psychiatrist as soon as possible so we can make sure we are on the right track to helping you get this all under control.   Please keep check of your blood pressure and let me know if this is staying higher than 130/85 on a regular basis.   We will plan to follow-up in February to see how you are doing.

## 2024-06-26 NOTE — Assessment & Plan Note (Signed)
 Dysuria and hesitancy reported with urination. Vaginal swelling also reported, but patient denies any other symptoms or concerns for ST infections. Given that we are upon a holiday week where the office will be closed for a few days, will go ahead and send treatment with macrobid . Referral for GYN placed for vaginal swelling.

## 2024-06-26 NOTE — Assessment & Plan Note (Signed)
 Depressive symptoms appear to be better controlled at this time. She has not yet started abilify , but remains on seroquel . Unfortunately, she missed her psychiatry appointment, but she plans to make a new one after our call today, which is reassuring. I have encouraged her to follow through with this to ensure that we have the most appropriate treatment options available for her. No alarm symptoms present at this time. Will continue to monitor. Recommend starting Abilify  as soon as possible to help with mood, particularly during these more stressful times of the year.

## 2024-06-26 NOTE — Assessment & Plan Note (Signed)
 Home BP cuff is not working properly. She plans to buy a new on today and continue monitoring. She is not sure if her BP has been higher because her cuff was not working or increased stress with preparing for Thanksgiving She would like to continue to monitor her BP at home over the next 2 weeks and she will let me know if the readings are higher than 130/80.

## 2024-06-26 NOTE — Assessment & Plan Note (Signed)
 See depression

## 2024-08-02 ENCOUNTER — Telehealth: Payer: Self-pay

## 2024-08-02 ENCOUNTER — Telehealth: Payer: Self-pay | Admitting: Nurse Practitioner

## 2024-08-02 ENCOUNTER — Other Ambulatory Visit: Payer: Self-pay

## 2024-08-02 DIAGNOSIS — M545 Low back pain, unspecified: Secondary | ICD-10-CM

## 2024-08-02 DIAGNOSIS — Z6838 Body mass index (BMI) 38.0-38.9, adult: Secondary | ICD-10-CM

## 2024-08-02 DIAGNOSIS — R635 Abnormal weight gain: Secondary | ICD-10-CM

## 2024-08-02 NOTE — Telephone Encounter (Signed)
 Copied from CRM #[id number]. Topic: Clinical - Medication Question >> Aug 02, 2024  8:58 AM Nancy Decker wrote: Reason for CRM: Patient called about some referrals that she states she requested previously, sent CRM's with her request but states she would like to speak with a nurse of NP Early in regards to what is going on.  Patient can be reached at [phone]

## 2024-08-02 NOTE — Telephone Encounter (Signed)
 Copied from CRM #[id number]. Topic: Referral - Request for Referral >> Aug 02, 2024  8:57 AM Nancy Decker wrote: Did the patient discuss referral with their provider in the last year? yes  Appointment offered? No  Type of order/referral and detailed reason for visit: Pain Management (back pain) and Weight Management  Preference of office, provider, location: open to suggestions.  If referral order, have you been seen by this specialty before? Yes, but has been a while.  Can we respond through MyChart? Yes

## 2024-08-02 NOTE — Telephone Encounter (Signed)
 Copied from CRM [id number]. Topic: Referral - Question >> Aug 02, 2024  8:55 AM Nancy Decker wrote: Reason for CRM: Patient was referred to Gynecology and her referral was denied, patient is requesting for another referral to be placed asap as she needs to see a specialist.   Patient can be reached at [phone]

## 2024-08-08 ENCOUNTER — Telehealth: Payer: Self-pay

## 2024-08-08 NOTE — Telephone Encounter (Signed)
 Called pt. Back had to LM.    Copied from CRM #[id number]. Topic: General - Other >> Aug 07, 2024  4:02 PM Thersia BROCKS wrote: Reason for CRM: Patient called in wanting to speak with NP Camie Doing would like a callback , would like a callback

## 2024-08-28 ENCOUNTER — Institutional Professional Consult (permissible substitution): Admitting: Nurse Practitioner
